# Patient Record
Sex: Female | Born: 1960 | Race: White | Hispanic: No | Marital: Married | Smoking: Never smoker
Health system: Southern US, Community
[De-identification: ages and names within clinical notes are randomized; demographics above are authoritative.]

## PROBLEM LIST (undated history)

## (undated) DIAGNOSIS — D649 Anemia, unspecified: Secondary | ICD-10-CM

## (undated) DIAGNOSIS — E079 Disorder of thyroid, unspecified: Secondary | ICD-10-CM

## (undated) DIAGNOSIS — R011 Cardiac murmur, unspecified: Secondary | ICD-10-CM

## (undated) DIAGNOSIS — E039 Hypothyroidism, unspecified: Secondary | ICD-10-CM

## (undated) DIAGNOSIS — N644 Mastodynia: Secondary | ICD-10-CM

## (undated) DIAGNOSIS — T7840XA Allergy, unspecified, initial encounter: Secondary | ICD-10-CM

## (undated) DIAGNOSIS — J309 Allergic rhinitis, unspecified: Secondary | ICD-10-CM

## (undated) DIAGNOSIS — E785 Hyperlipidemia, unspecified: Secondary | ICD-10-CM

## (undated) DIAGNOSIS — I341 Nonrheumatic mitral (valve) prolapse: Secondary | ICD-10-CM

## (undated) HISTORY — PX: MOUTH SURGERY: SHX715

## (undated) HISTORY — DX: Anemia, unspecified: D64.9

## (undated) HISTORY — PX: ABDOMINAL HYSTERECTOMY: SHX81

## (undated) HISTORY — DX: Cardiac murmur, unspecified: R01.1

## (undated) HISTORY — DX: Mastodynia: N64.4

## (undated) HISTORY — DX: Disorder of thyroid, unspecified: E07.9

## (undated) HISTORY — DX: Allergic rhinitis, unspecified: J30.9

## (undated) HISTORY — DX: Allergy, unspecified, initial encounter: T78.40XA

---

## 1999-02-24 ENCOUNTER — Encounter: Payer: Self-pay | Admitting: Oral and Maxillofacial Surgery

## 1999-02-26 ENCOUNTER — Observation Stay (HOSPITAL_COMMUNITY): Admission: RE | Admit: 1999-02-26 | Discharge: 1999-02-27 | Payer: Self-pay | Admitting: Oral and Maxillofacial Surgery

## 1999-04-24 ENCOUNTER — Other Ambulatory Visit: Admission: RE | Admit: 1999-04-24 | Discharge: 1999-04-24 | Payer: Self-pay | Admitting: Obstetrics and Gynecology

## 2000-08-09 ENCOUNTER — Other Ambulatory Visit: Admission: RE | Admit: 2000-08-09 | Discharge: 2000-08-09 | Payer: Self-pay | Admitting: Obstetrics and Gynecology

## 2000-12-23 ENCOUNTER — Ambulatory Visit (HOSPITAL_COMMUNITY): Admission: RE | Admit: 2000-12-23 | Discharge: 2000-12-23 | Payer: Self-pay | Admitting: Obstetrics and Gynecology

## 2000-12-23 ENCOUNTER — Encounter (INDEPENDENT_AMBULATORY_CARE_PROVIDER_SITE_OTHER): Payer: Self-pay | Admitting: Specialist

## 2002-06-28 ENCOUNTER — Encounter: Payer: Self-pay | Admitting: Family Medicine

## 2002-06-28 LAB — CONVERTED CEMR LAB

## 2002-07-06 ENCOUNTER — Other Ambulatory Visit: Admission: RE | Admit: 2002-07-06 | Discharge: 2002-07-06 | Payer: Self-pay | Admitting: Obstetrics and Gynecology

## 2002-09-28 HISTORY — PX: ABDOMINAL HYSTERECTOMY: SHX81

## 2003-03-01 ENCOUNTER — Observation Stay (HOSPITAL_COMMUNITY): Admission: RE | Admit: 2003-03-01 | Discharge: 2003-03-02 | Payer: Self-pay | Admitting: Obstetrics & Gynecology

## 2003-03-01 ENCOUNTER — Encounter (INDEPENDENT_AMBULATORY_CARE_PROVIDER_SITE_OTHER): Payer: Self-pay

## 2004-07-29 ENCOUNTER — Ambulatory Visit: Payer: Self-pay | Admitting: Family Medicine

## 2004-09-25 ENCOUNTER — Ambulatory Visit: Payer: Self-pay | Admitting: Internal Medicine

## 2004-11-13 ENCOUNTER — Ambulatory Visit: Payer: Self-pay | Admitting: Family Medicine

## 2005-09-09 ENCOUNTER — Ambulatory Visit: Payer: Self-pay | Admitting: Family Medicine

## 2006-01-22 ENCOUNTER — Ambulatory Visit: Payer: Self-pay | Admitting: Family Medicine

## 2006-06-14 ENCOUNTER — Ambulatory Visit: Payer: Self-pay | Admitting: Family Medicine

## 2006-06-14 ENCOUNTER — Encounter: Admission: RE | Admit: 2006-06-14 | Discharge: 2006-06-14 | Payer: Self-pay | Admitting: Family Medicine

## 2007-02-17 ENCOUNTER — Telehealth: Payer: Self-pay | Admitting: Family Medicine

## 2007-12-22 ENCOUNTER — Encounter: Payer: Self-pay | Admitting: Family Medicine

## 2007-12-22 DIAGNOSIS — J309 Allergic rhinitis, unspecified: Secondary | ICD-10-CM | POA: Insufficient documentation

## 2007-12-22 DIAGNOSIS — D649 Anemia, unspecified: Secondary | ICD-10-CM | POA: Insufficient documentation

## 2007-12-23 ENCOUNTER — Ambulatory Visit: Payer: Self-pay | Admitting: Family Medicine

## 2007-12-23 DIAGNOSIS — N644 Mastodynia: Secondary | ICD-10-CM

## 2008-07-11 ENCOUNTER — Ambulatory Visit: Payer: Self-pay | Admitting: Family Medicine

## 2008-08-16 ENCOUNTER — Ambulatory Visit: Payer: Self-pay | Admitting: Family Medicine

## 2008-08-16 DIAGNOSIS — R071 Chest pain on breathing: Secondary | ICD-10-CM | POA: Insufficient documentation

## 2008-09-26 ENCOUNTER — Ambulatory Visit: Payer: Self-pay | Admitting: Family Medicine

## 2008-09-26 DIAGNOSIS — L989 Disorder of the skin and subcutaneous tissue, unspecified: Secondary | ICD-10-CM | POA: Insufficient documentation

## 2008-09-26 LAB — CONVERTED CEMR LAB
KOH Prep: NEGATIVE
Whiff Test: NEGATIVE

## 2008-12-24 ENCOUNTER — Telehealth: Payer: Self-pay | Admitting: Family Medicine

## 2008-12-26 ENCOUNTER — Encounter: Payer: Self-pay | Admitting: Family Medicine

## 2009-04-18 ENCOUNTER — Ambulatory Visit: Payer: Self-pay | Admitting: Family Medicine

## 2009-11-27 ENCOUNTER — Telehealth: Payer: Self-pay | Admitting: Family Medicine

## 2010-10-28 NOTE — Progress Notes (Signed)
Summary: flonase and allegra  Phone Note Call from Patient Call back at [phone]   Caller: Patient Call For: Judith Part MD Summary of Call: Patient says that she has been having problems with her allergies lately, her eyes are swelling and runny nose. She said that she is now going to be using mail order pharmacy with her husband and wants to know if she can get written script for flonase and allegra to come in and pick up to send to mail order. She also wants to know if she can get enought sent to [facility] until she receives it through the mail order. Please advise.  Initial call taken by: Melody Comas,  November 27, 2009 11:50 AM  Follow-up for Phone Call        that is just fine will print one and do one for call in  just in case I have to do prior auth for allegra (often do)- I need to document that she failed or was intol of otc zyrtec and claritin please verify this just in case  here are px Follow-up by: Judith Part MD,  November 27, 2009 1:27 PM  Additional Follow-up for Phone Call Additional follow up Details #1::        Patient notified as instructed by telephone. Pt tried Zyrtec 3 years ago with no relief from symptoms and tried Claritin 2 years ago with no relief from symptoms. Prescription left at front desk. Medication phoned to [facility] as instructed. Lewanda Rife LPN  November 28, 3327 5:20 PM     New/Updated Medications: FEXOFENADINE HCL 180 MG  TABS (FEXOFENADINE HCL) take one by mouth once daily as needed ALLEGRA 180 MG TABS (FEXOFENADINE HCL) 1 by mouth once daily FLONASE 50 MCG/ACT SUSP (FLUTICASONE PROPIONATE) 2 sprays in each nostril once daily Prescriptions: FLONASE 50 MCG/ACT SUSP (FLUTICASONE PROPIONATE) 2 sprays in each nostril once daily  #3 months x 3   Entered and Authorized by:   Judith Part MD   Signed by:   Judith Part MD on 11/27/2009   Method used:   Print then Give to Patient   RxID:   [id number] ALLEGRA 180 MG TABS  (FEXOFENADINE HCL) 1 by mouth once daily  #90 x 3   Entered and Authorized by:   Judith Part MD   Signed by:   Judith Part MD on 11/27/2009   Method used:   Print then Give to Patient   RxID:   [id number] FLUTICASONE PROPIONATE 50 MCG/ACT  SUSP (FLUTICASONE PROPIONATE) 2 sprays in each nostril daily as needed  #1 mdi x 0   Entered and Authorized by:   Judith Part MD   Signed by:   Judith Part MD on 11/27/2009   Method used:   Telephoned to ...       [facility] (retail)       [address]       [place], [place]  [address]       Ph: [phone] or [phone]       Fax: [phone]   RxID:   [id number] FEXOFENADINE HCL 180 MG  TABS (FEXOFENADINE HCL) take one by mouth once daily as needed  #30 x 0   Entered and Authorized by:   Judith Part MD   Signed by:   Judith Part MD on 11/27/2009   Method used:   Telephoned to .Marland KitchenMarland Kitchen  [facility] (retail)       [address]       [place], [place]  [address]       Ph: [phone] or [phone]       Fax: [phone]   RxID:   [id number]

## 2010-11-21 ENCOUNTER — Ambulatory Visit (INDEPENDENT_AMBULATORY_CARE_PROVIDER_SITE_OTHER): Payer: BC Managed Care – PPO | Admitting: Family Medicine

## 2010-11-21 ENCOUNTER — Encounter: Payer: Self-pay | Admitting: Family Medicine

## 2010-11-21 DIAGNOSIS — J019 Acute sinusitis, unspecified: Secondary | ICD-10-CM

## 2010-11-25 NOTE — Assessment & Plan Note (Signed)
Summary: ?SINUS INFECTION,EAR/CLE   [employer]   Vital Signs:  Patient profile:   50 year old female Weight:      136 pounds Temp:     98 degrees F oral Pulse rate:   68 / minute Pulse rhythm:   regular BP sitting:   128 / 88  (left arm) Cuff size:   regular  Vitals Entered By: Lowella Petties CMA, AAMA (November 21, 2010 2:59 PM) CC: Left side face pain, sinus drainage, left ear hurtin, sore throat.   History of Present Illness: CC: sinus infection?  1+ mo h/o not feeling great - sinus congestion, pressure headache, sore throat, L side of face hurting.  + R sided epistaxis today.  Taking allegra consistently over last several weeks.  taking ibuprofen as well.  was taking flonase but ran out last month.  also using mucinex.  clear discharge from nose, sometimes yellow.  + eyes itcy this week.  in ams crusty.  Has been using visine drops.   No cough, fevers/chills, abd pain, n/v/d, rashes.  No sick contacts at home,  no smokers at home, no h/o asthma.    Current Medications (verified): 1)  Fexofenadine Hcl 180 Mg  Tabs (Fexofenadine Hcl) .... Take One By Mouth Once Daily As Needed 2)  Fluticasone Propionate 50 Mcg/act  Susp (Fluticasone Propionate) .... 2 Sprays in Each Nostril Daily As Needed  Allergies (verified): 1)  ! Penicillin 2)  ! Zithromax  Past History:  Past Medical History: Last updated: 09/26/2008 BREAST PAIN (ICD-611.71) ANEMIA-NOS (ICD-285.9) ALLERGIC RHINITIS (ICD-477.9)   gyn-Dr Dareen Piano    Social History: Last updated: 07/11/2008 Marital Status: Married Children: 2 Occupation: Runner, broadcasting/film/video non smoker   Review of Systems       per HPI  Physical Exam  General:  alert, well-developed, well-nourished, and well-hydrated.  NAD Head:  normocephalic, atraumatic, and no abnormalities observed.  L maxillary sinus tenderness to palpation Eyes:  pupils equal, pupils round, and no injection.   Ears:  TMs clear bilaterally, canals clear Nose:  R side with dry  blood, L with congestion Mouth:  good dentition, pharynx pink and moist, and no exudates.   Neck:  No deformities, masses, or tenderness noted.  no LAD Lungs:  Normal respiratory effort, chest expands symmetrically. Lungs are clear to auscultation, no crackles or wheezes. Heart:  Normal rate and regular rhythm. S1 and S2 normal without gallop, murmur, click, rub or other extra sounds. Pulses:  2+ rad pulses, brisk cap refill   Impression & Recommendations:  Problem # 1:  SINUSITIS- ACUTE-NOS (ICD-461.9) vs chronic as going on >1 mo.  however no abx treatment up to now.  doing appropriate supportive care at home, rec back off flonase until bleeding resolves, rec back off allegra as can be drying.  start bactrim DS two times a day x 10 days.  update if not improving as expected.  for presumed allergic conjunctivitis treat with patanol eye drops.  The following medications were removed from the medication list:    Flonase 50 Mcg/act Susp (Fluticasone propionate) .Marland Kitchen... 2 sprays in each nostril once daily Her updated medication list for this problem includes:    Fluticasone Propionate 50 Mcg/act Susp (Fluticasone propionate) .Marland Kitchen... 2 sprays in each nostril daily as needed    Bactrim Ds 800-160 Mg Tabs (Sulfamethoxazole-trimethoprim) ..... One by mouth two times a day x 10 days  Complete Medication List: 1)  Fexofenadine Hcl 180 Mg Tabs (Fexofenadine hcl) .... Take one by mouth once daily as  needed 2)  Fluticasone Propionate 50 Mcg/act Susp (Fluticasone propionate) .... 2 sprays in each nostril daily as needed 3)  Patanol 0.1 % Soln (Olopatadine hcl) .... One drop two times a day to each eye for allergies 4)  Bactrim Ds 800-160 Mg Tabs (Sulfamethoxazole-trimethoprim) .... One by mouth two times a day x 10 days  Patient Instructions: 1)  You have a sinus infection. 2)  Take medicines as prescribed: bactrim ds twice daily for 10 days 3)  Take guaifenesin 400mg  IR 1 1/2 pills in am and at noon or  simple mucinex with plenty of fluid to help mobilize mucous. 4)  Use nasal saline spray or neti pot to help drainage of sinuses. 5)  hold off on allegra 6)  hold off on flonase until bleeding resolves. 7)  patanol for eye itch. 8)  If you start having fevers >101.5, trouble swallowing or breathing, or are worsening instead of improving as expected, you may need to be seen again. 9)  Good to see you today, call clinic with questions.  Prescriptions: BACTRIM DS 800-160 MG TABS (SULFAMETHOXAZOLE-TRIMETHOPRIM) one by mouth two times a day x 10 days  #20 x 0   Entered and Authorized by:   Eustaquio Boyden  MD   Signed by:   Eustaquio Boyden  MD on 11/21/2010   Method used:   Electronically [facility]        [id number] ([facility]ail)       [address]       [place], [place]  [address]       Ph: [phone] or [phone]       Fax: [phone]   RxID:   [id number] PATANOL 0.1 % SOLN (OLOPATADINE HCL) one drop two times a day to each eye for allergies  #1 x 1   Entered and Authorized by:   Eustaquio Boyden  MD   Signed by:   Eustaquio Boyden  MD on 11/21/2010   Method used:   Electronically to        [facility] (retail)       [address]       [place], [place]  [address]       Ph: [phone] or [phone]       Fax: [phone]   RxID:   [id number]    Orders Added: 1)  Est. Patient Level III [20254]    Prior Medications (reviewed today): FEXOFENADINE HCL 180 MG  TABS (FEXOFENADINE HCL) take one by mouth once daily as needed FLUTICASONE PROPIONATE 50 MCG/ACT  SUSP (FLUTICASONE PROPIONATE) 2 sprays in each nostril daily as needed Current Allergies (reviewed today): ! PENICILLIN ! [facility]

## 2011-02-13 NOTE — Op Note (Signed)
[facility]  Patient:    Ruth Gill, Ruth Gill                     MRN: [id number] Proc. Date: 12/23/00 Adm. Date:  [id number] Attending:  Osborn Coho                           Operative Report  PREOPERATIVE DIAGNOSES:  1. Menorrhagia.  2. Dyspareunia.  3. History of endometriosis.  POSTOPERATIVE DIAGNOSES:  1. Menorrhagia.  2. Dyspareunia.  3. History of endometriosis.  4. Endometriosis on the right uterosacral ligament.  PROCEDURE:  1. Diagnostic laparoscopy with cauterization of endometriosis.  2. Hysteroscopy.  3. Dilation and curettage.  SURGEON:  Dr. Dareen Piano.  ANESTHESIA:  General endotracheal.  ESTIMATED BLOOD LOSS:  Minimal.  COMPLICATIONS:  None.  DRAINS:  Red rubber catheter bladder.  SPECIMENS:  Endocervical and endometrial curettings sent to pathology.  DESCRIPTION OF PROCEDURE:  The patient was taken to the operating room where she was placed in the dorsal supine position. A general endotracheal anesthetic was administered without complications. She was then placed in the dorsal lithotomy position and prepped with Betadine. Her bladder was drained with a red rubber catheter. A Hulka tenaculum was applied to the anterior cervical lip. She was then draped in the usual fashion for this procedure. The umbilicus was then injected with 0.25% Marcaine. A vertical skin incision was made, a Veress needle was placed in the peritoneal cavity. Three liters of carbon dioxide was placed. The 11 mm trocar was then placed in the peritoneal cavity. The scope was then placed. A 5 mm port was placed in the suprapubic region under direct visualization. On examination, the patient had a normal appearing liver and gallbladder. The appendix was not visualized. It was either previously removed or retrocecal. The patient had a normal anterior cul-de-sac. The uterus appeared to be normal. The fallopian tubes and ovaries were normal bilaterally,  pelvic side walls were normal. The patient had one area of endometriosis in the mid portion of the right uterosacral ligaments otherwise no other endometriosis was found. This area was then cauterized with Klepinger forceps. This concluded the laparoscopy. The instruments were removed and pneumoperitoneum release and the fascia closed with #0 Vicryl suture. The skin was closed with 4-0 Vicryl suture. Steri-Strips were then placed. The patient tolerated the procedure well. Next hysteroscopy was performed. The cervical os was then dilated to a 29 french. The hysteroscope was advanced through the endocervical canal which appeared to be normal. On entering the uterine cavity, the patient had extensive thickening of her endometrial lining with some polyps. There was no evidence of any fibroids. The hysteroscope was removed and sharp curettage was performed obtaining endometrial and endocervical curettings. The hysteroscope was then placed back into the uterine cavity and no evidence of polyps were seen. Again no fibroids were seen. The patient tolerated the procedure well. She was taken to the recovery room in stable condition. Instrument and lap counts were correct x 2. The patient was discharged to home with Tylox to take p.r.n. She will follow-up in the office in four weeks. DD:  12/23/00 TD:  12/24/00 Job: [id number]

## 2011-02-13 NOTE — H&P (Signed)
NAME:  Ruth Gill, Ruth Gill                        ACCOUNT NO.:  [id number]   MEDICAL RECORD NO.:  [id number]                   PATIENT TYPE:  INP   LOCATION:  NA                                   FAC[facility]   PHYSICIAN:  Malva Limes, M.D.                 DATE OF BIRTH:  1961-06-27   DATE OF ADMISSION:  DATE OF DISCHARGE:                                HISTORY & PHYSICAL   HISTORY OF PRESENT ILLNESS:  The patient is a 50 year old white female G2,  P2 who presents today for laparoscopic assisted vaginal hysterectomy  secondary to a multiple year history of worsening menometrorrhagia and  dyspareunia.  The patient has a history of endometriosis.  The patient  underwent diagnostic laparoscopy with cauterization of endometriosis in  2002.  At that time endometriosis was involving the right uterosacral  ligament.  The patient also had a hysteroscopy and dilatation and curettage  at that time.  There was no evidence of any polyps or even fibroids.  The  pathology was benign.  The patient presented to the office complaining of  worsening symptoms in March of 2004.  The patient was offered endometrial  ablation, intrauterine device, total vaginal hysterectomy, or another  dilatation and curettage.  The patient did not receive any relief with oral  contraceptive pills.  The patient wished to proceed with total vaginal  hysterectomy.  Because of her history of endometriosis, the patient will  have a laparoscopy prior to proceeding with a vaginal hysterectomy.   ALLERGIES:  The patient has allergy to PENICILLIN and ZITHROMAX.   SOCIAL HISTORY:  She denies smoking or drug use.   PAST OBSTETRICAL HISTORY:  The patient has had one vaginal birth after  cesarean section and one cesarean section.   PHYSICAL EXAMINATION:  GENERAL:  The patient is a well-developed, well-  nourished white female in no apparent distress.  HEENT:  Within normal limits.  LUNGS:  Clear to auscultation.  CARDIOVASCULAR:  Regular rate and rhythm without murmur.  BREASTS:  Without masses, lymphadenopathy.  ABDOMEN:  Soft, nontender, nondistended.  There is no organomegaly.  There  is no rebound or guarding.  EXTREMITIES:  Within normal limits.  PELVIC:  Normal external genitalia.  The vagina is without lesions or  masses.  Cervix is parous.  Uterus is normal size and shape.  There is no  adnexal tenderness or masses.   IMPRESSION:  1. Menometrorrhagia.  2. Dyspareunia.  3. History of endometriosis.   PLAN:  Proceed with diagnostic laparoscopy followed by total vaginal  hysterectomy.                                               Malva Limes, M.D.   MA/MEDQ  D:  03/01/2003  T:  03/01/2003  Job:  [id number]

## 2011-02-13 NOTE — Op Note (Signed)
Ruth Gill, Ruth Gill                        ACCOUNT NO.:  [id number]   MEDICAL RECORD NO.:  [id number]                   PATIENT TYPE:  OBV   LOCATION:  9399                                 FACILITY:  [facility]   PHYSICIAN:  Malva Limes, M.D.                 DATE OF BIRTH:  1960/12/15   DATE OF PROCEDURE:  02/28/2002  DATE OF DISCHARGE:                                 OPERATIVE REPORT   PREOPERATIVE DIAGNOSES:  1. Menometrorrhagia.  2. Dyspareunia.  3. History of endometriosis.   POSTOPERATIVE DIAGNOSES:  1. Menometrorrhagia.  2. Dyspareunia.  3. History of endometriosis.   PROCEDURE:  1. Diagnostic laparoscopy.  2. Total vaginal hysterectomy.   SURGEON:  Malva Limes, M.D.   ASSISTANT:  Randye Lobo, M.D.   ESTIMATED BLOOD LOSS:  100 mL.   COMPLICATIONS:  None.   SPECIMENS:  Uterus and cervix sent to pathology.   ANTIBIOTICS:  Ancef 1 gram.   ANESTHESIA:  General endotracheal.   DRAINS:  Foley to bedside drainage.   DESCRIPTION OF PROCEDURE:  The patient was taken to the operating room where  general anesthetic was administered without complications.  She was then  placed in the dorsal lithotomy position and prepped with Hibiclens.  Her  bladder was drained with a red rubber catheter.  A Hulka tenaculum was  applied to the anterior cervical lip.  A vertical skin incision was made in  the umbilicus.  The fascia was grasped, incised with the Mayos, and the  parietal peritoneum was entered bluntly.  A Hasson cannula was then placed  in the peritoneal cavity and 3 liters of carbon dioxide was insufflated.  A  5 mm port was then placed in the suprapubic region under direct  visualization.  The patient was then placed in Trendelenburg.  On  examination the patient had no evidence of endometriosis in her abdomen or  pelvis.  There was no evidence of any adhesions.  The uterus appeared to be  boggy and extremely retroverted and retroflexed.  The ovaries and  fallopian  tubes were normal bilaterally.  The appendix was not visualized.  The  patient did have a small simple cyst on the left ovary which was incised and  clear serous fluid seen to be draining.   At this point attention was turned to the vagina.  The cervix was injected  with 1% lidocaine with epinephrine.  The posterior cul-de-sac was entered  sharply.  The uterosacral ligaments were bilaterally clamped, cut, and  ligated with 0 Monocryl suture.  The remaining cervix was circumscribed.  The anterior cul-de-sac was entered sharply.  The cardinal ligaments were  then serially clamped, cut, and ligated with 0 Monocryl suture.  The uterine  vessels were bilaterally clamped, cut, and ligated with 0 Monocryl suture.  Once the level of the fallopian tube was reached the uterus was inverted and  the fallopian tube, round ligament,  and ovarian ligament were bilaterally  clamped, cut, and ligated x2 with 0 Monocryl suture.  All pedicles were then  checked and found to be hemostatic.  There was a small area of bleeding on  the vaginal mucosa at 3 o'clock which was sutured with 0 Monocryl suture in  figure-of-eight fashion.  Once this was accomplished the parietal peritoneum  and vaginal mucosa were closed using 2-0 Vicryl suture in a running locking  fashion.  Following this the abdomen was re-insufflated and the scope was  placed and all pedicles again were checked and felt to be hemostatic.  The  pelvis was copiously irrigated.  This concluded the procedure.  The  instruments and pneumoperitoneum released.  The fascia was closed with 0  Vicryl suture.  The skin was closed with 4-0 Vicryl suture.  The patient  tolerated the procedure well and she was taken to the recovery room in  stable condition.  Instrument and lap counts were correct x2.                                               Malva Limes, M.D.    MA/MEDQ  D:  03/01/2003  T:  03/01/2003  Job:  [id number]

## 2011-09-01 ENCOUNTER — Encounter: Payer: Self-pay | Admitting: Family Medicine

## 2011-09-02 ENCOUNTER — Encounter: Payer: Self-pay | Admitting: Family Medicine

## 2011-09-02 ENCOUNTER — Ambulatory Visit (INDEPENDENT_AMBULATORY_CARE_PROVIDER_SITE_OTHER): Payer: BC Managed Care – PPO | Admitting: Family Medicine

## 2011-09-02 DIAGNOSIS — J0100 Acute maxillary sinusitis, unspecified: Secondary | ICD-10-CM

## 2011-09-02 MED ORDER — LEVOFLOXACIN 500 MG PO TABS: 500.0000 mg | ORAL_TABLET | Freq: Every day | ORAL | Status: AC

## 2011-09-02 NOTE — Progress Notes (Signed)
  Patient Name: Ruth Gill Date of Birth: 10-29-1960 Age: 50 y.o. Medical Record Number: [id number] Gender: female  History of Present Illness:  Ruth Gill is a 50 y.o. very pleasant female patient who presents with the following:  HA, some fever. At first was clear.  Hard to breath at night.  Left side more.   Patient has had mild fever, nasal drainage, cough, but mostly worsening sinus pain the last couple of days, sick around a week. Left sided max pain the greatest. Some discharge. Some ear pain.   I have reviewed the patient's medical history in detail and updated the computerized patient record.  ROS: GEN: Acute illness details above GI: Tolerating PO intake GU: maintaining adequate hydration and urination Pulm: No SOB Interactive and getting along well at home.  Otherwise, ROS is as per the HPI.    Gen: WDWN, NAD; alert,appropriate and cooperative throughout exam  HEENT: Normocephalic and atraumatic. Throat clear, w/o exudate, no LAD, R TM clear, L TM - good landmarks, No fluid present. rhinnorhea.  Left frontal and maxillary sinuses: Tender at max Right frontal and maxillary sinuses: non Tender  Neck: No ant or post LAD CV: RRR, No M/G/R Pulm: Breathing comfortably in no resp distress. no w/c/r Abd: S,NT,ND,+BS Extr: no c/c/e Psych: full affect, pleasant  Acute sinusitis: ABX as below.  Refer to the patient instructions sections for details of plan shared with patient.  Reviewed symptomatic care as well as ABX in this case.

## 2011-09-25 ENCOUNTER — Telehealth: Payer: Self-pay | Admitting: *Deleted

## 2011-09-25 NOTE — Telephone Encounter (Signed)
error 

## 2012-08-05 ENCOUNTER — Ambulatory Visit (INDEPENDENT_AMBULATORY_CARE_PROVIDER_SITE_OTHER): Payer: BC Managed Care – PPO | Admitting: Family Medicine

## 2012-08-05 ENCOUNTER — Encounter: Payer: Self-pay | Admitting: Family Medicine

## 2012-08-05 VITALS — BP 124/76 | HR 64 | Temp 97.5°F | Ht 63.0 in | Wt 121.0 lb

## 2012-08-05 DIAGNOSIS — J019 Acute sinusitis, unspecified: Secondary | ICD-10-CM | POA: Insufficient documentation

## 2012-08-05 DIAGNOSIS — B9689 Other specified bacterial agents as the cause of diseases classified elsewhere: Secondary | ICD-10-CM

## 2012-08-05 MED ORDER — LEVOFLOXACIN 500 MG PO TABS: 500.0000 mg | ORAL_TABLET | Freq: Every day | ORAL | Status: DC

## 2012-08-05 MED ORDER — FLUTICASONE PROPIONATE 50 MCG/ACT NA SUSP: 2.0000 | Freq: Every day | NASAL | Status: DC | PRN

## 2012-08-05 NOTE — Assessment & Plan Note (Signed)
With R sided sinus and ear pain / tenderness after over 7 d of uri tx with levaquin (based on abx all) Disc symptomatic care - see instructions on AVS  Will also get back on flonase for ETD

## 2012-08-05 NOTE — Progress Notes (Signed)
  Subjective:    Patient ID: Ruth Gill, female    DOB: [birth year], 51 y.o.   MRN: [id number]  HPI Thinks she has a sinus infection  Has been over a week of symptoms- congestion and drainage and pressure/ pain on R side of face and ear Teeth hurt too Feels like her ears need to pop Mucous is clear thus far No fever  A little dry cough- not bad  Has taken allegra and some ibuprofen  Is out of flonase  Does do salt water rinse   Patient Active Problem List  Diagnosis  . ANEMIA-NOS  . ALLERGIC RHINITIS  . BREAST PAIN  . UNSPECIFIED DISORDER OF SKIN&SUBCUTANEOUS TISSUE  . CHEST WALL PAIN, ACUTE   Past Medical History  Diagnosis Date  . Mastodynia   . Anemia, unspecified   . Allergic rhinitis, cause unspecified    Past Surgical History  Procedure Date  . Abdominal hysterectomy    History  Substance Use Topics  . Smoking status: Never Smoker   . Smokeless tobacco: Not on file  . Alcohol Use: No   No family history on file. Allergies  Allergen Reactions  . Azithromycin     REACTION: joint swelling, stiffness  . Penicillins     REACTION: SOB, rash   Current Outpatient Prescriptions on File Prior to Visit  Medication Sig Dispense Refill  . estradiol (ESTRACE) 1 MG tablet Take 1 tablet by mouth daily.       . fexofenadine (ALLEGRA) 180 MG tablet Take 180 mg by mouth daily as needed.        . fluticasone (FLONASE) 50 MCG/ACT nasal spray Place 2 sprays into the nose daily as needed.           Review of Systems Review of Systems  Constitutional: Negative for fever, appetite change,  and unexpected weight change.  ENT pos for congestion and drip and facial pain  Eyes: Negative for pain and visual disturbance. neg for redness Respiratory: Negative for wheeze and shortness of breath.   Cardiovascular: Negative for cp or palpitations    Gastrointestinal: Negative for nausea, diarrhea and constipation.  Genitourinary: Negative for urgency and frequency.  Skin:  Negative for pallor or rash   Neurological: Negative for weakness, light-headedness, numbness and headaches.  Hematological: Negative for adenopathy. Does not bruise/bleed easily.  Psychiatric/Behavioral: Negative for dysphoric mood. The patient is not nervous/anxious.         Objective:   Physical Exam  Constitutional: She appears well-developed and well-nourished. No distress.  HENT:  Head: Normocephalic and atraumatic.  Right Ear: External ear normal.  Left Ear: External ear normal.  Mouth/Throat: Oropharynx is clear and moist. No oropharyngeal exudate.       Nares are injected and congested  Significant frontal sinus tenderness  Marked R maxillary sinus tenderness  Eyes: Conjunctivae normal and EOM are normal. Pupils are equal, round, and reactive to light. Right eye exhibits no discharge. Left eye exhibits no discharge.  Neck: Normal range of motion. Neck supple. No thyromegaly present.  Cardiovascular: Normal rate and regular rhythm.   Pulmonary/Chest: Effort normal and breath sounds normal. No respiratory distress. She has no wheezes. She has no rales.  Lymphadenopathy:    She has no cervical adenopathy.  Neurological: She is alert. No cranial nerve deficit.  Skin: Skin is warm and dry. No rash noted.  Psychiatric: She has a normal mood and affect.          Assessment & Plan:

## 2012-08-05 NOTE — Patient Instructions (Addendum)
For sinus infection take levaquin as directed  Drink lots of water Get back on flonase daily  In addition - do salt water nasal rinse any time you want  mucinex is good for congestion as is ibuprofen Update if not starting to improve in a week or if worsening

## 2012-12-23 ENCOUNTER — Ambulatory Visit (INDEPENDENT_AMBULATORY_CARE_PROVIDER_SITE_OTHER): Payer: BC Managed Care – PPO | Admitting: Internal Medicine

## 2012-12-23 ENCOUNTER — Encounter: Payer: Self-pay | Admitting: Internal Medicine

## 2012-12-23 VITALS — BP 110/80 | HR 69 | Temp 97.5°F | Wt 123.0 lb

## 2012-12-23 DIAGNOSIS — J019 Acute sinusitis, unspecified: Secondary | ICD-10-CM

## 2012-12-23 DIAGNOSIS — B9689 Other specified bacterial agents as the cause of diseases classified elsewhere: Secondary | ICD-10-CM

## 2012-12-23 MED ORDER — LEVOFLOXACIN 500 MG PO TABS: 500.0000 mg | ORAL_TABLET | Freq: Every day | ORAL | Status: DC

## 2012-12-23 NOTE — Progress Notes (Signed)
  Subjective:    Patient ID: Ruth Gill, female    DOB: 10-10-1960, 52 y.o.   MRN: [id number]  HPI Having sinus infection symptoms again Pain in right maxillary area and down along neck Pain in right ear Congestion and blowing out yellow mucus Going on for a week  Using flonase, aerborne, ibuprofen, mucinex D---not really helping  No fever No SOB No sore fever Does feel the PND  Current Outpatient Prescriptions on File Prior to Visit  Medication Sig Dispense Refill  . estradiol (ESTRACE) 1 MG tablet Take 1 tablet by mouth daily.       . fexofenadine (ALLEGRA) 180 MG tablet Take 180 mg by mouth daily as needed.        . fluticasone (FLONASE) 50 MCG/ACT nasal spray Place 2 sprays into the nose daily as needed.  16 g  11   No current facility-administered medications on file prior to visit.    Allergies  Allergen Reactions  . Azithromycin     REACTION: joint swelling, stiffness  . Penicillins     REACTION: SOB, rash    Past Medical History  Diagnosis Date  . Mastodynia   . Anemia, unspecified   . Allergic rhinitis, cause unspecified     Past Surgical History  Procedure Laterality Date  . Abdominal hysterectomy      No family history on file.  History   Social History  . Marital Status: Married    Spouse Name: N/A    Number of Children: 2  . Years of Education: N/A   Occupational History  . teacher [employer]   Social History Main Topics  . Smoking status: Never Smoker   . Smokeless tobacco: Not on file  . Alcohol Use: No  . Drug Use: No  . Sexually Active: Not on file   Other Topics Concern  . Not on file   Social History Narrative  . No narrative on file   Review of Systems Voice goes by the end of the day No rash  Appetite okay No vomiting or diarrhea     Objective:   Physical Exam  Constitutional: She appears well-developed and well-nourished. No distress.  HENT:  Mouth/Throat: Oropharynx is clear and moist. No  oropharyngeal exudate.  TMs normal Right maxillary tenderness Moderate nasal inflammation  Neck: Normal range of motion. Neck supple.  Small right anterior cervical node  Pulmonary/Chest: Effort normal and breath sounds normal. No respiratory distress. She has no wheezes. She has no rales.  Lymphadenopathy:    She has cervical adenopathy.          Assessment & Plan:

## 2012-12-23 NOTE — Assessment & Plan Note (Signed)
Sick for a week with ongoing symptoms Will treat with the levaquin she has tolerated in the past Discussed symtpomatic Rx

## 2013-08-03 ENCOUNTER — Other Ambulatory Visit: Payer: Self-pay | Admitting: Obstetrics and Gynecology

## 2013-08-04 LAB — HM MAMMOGRAPHY

## 2013-09-25 ENCOUNTER — Other Ambulatory Visit: Payer: Self-pay | Admitting: Family Medicine

## 2013-09-25 NOTE — Telephone Encounter (Signed)
Fax refill request, no recent/future appt. please advise

## 2013-09-25 NOTE — Telephone Encounter (Signed)
Please have her schedule a PE in spring or summer and refill until then

## 2013-09-26 ENCOUNTER — Encounter: Payer: Self-pay | Admitting: *Deleted

## 2013-09-26 MED ORDER — FLUTICASONE PROPIONATE 50 MCG/ACT NA SUSP: 2.0000 | Freq: Every day | NASAL | Status: DC | PRN

## 2013-09-26 NOTE — Telephone Encounter (Signed)
CPE scheduled and med refilled  

## 2013-09-26 NOTE — Telephone Encounter (Signed)
This encounter was created in error - please disregard.

## 2014-01-04 ENCOUNTER — Telehealth: Payer: Self-pay | Admitting: Family Medicine

## 2014-01-04 DIAGNOSIS — Z0000 Encounter for general adult medical examination without abnormal findings: Secondary | ICD-10-CM

## 2014-01-04 NOTE — Telephone Encounter (Signed)
Message copied by Judy PimpleWER, Seini Lannom A on Thu Jan 04, 2014 10:13 PM ------      Message from: Alvina ChouWALSH, TERRI J      Created: Tue Dec 26, 2013 12:43 PM      Regarding: Lab orders for Monday, 4.13.15       Patient is scheduled for CPX labs, please order future labs, Thanks , Terri       ------

## 2014-01-08 ENCOUNTER — Other Ambulatory Visit (INDEPENDENT_AMBULATORY_CARE_PROVIDER_SITE_OTHER): Payer: BC Managed Care – PPO

## 2014-01-08 DIAGNOSIS — Z0000 Encounter for general adult medical examination without abnormal findings: Secondary | ICD-10-CM

## 2014-01-08 LAB — COMPREHENSIVE METABOLIC PANEL
ALK PHOS: 39 U/L (ref 39–117)
ALT: 15 U/L (ref 0–35)
AST: 20 U/L (ref 0–37)
Albumin: 4.3 g/dL (ref 3.5–5.2)
BUN: 19 mg/dL (ref 6–23)
CO2: 29 mEq/L (ref 19–32)
Calcium: 9.5 mg/dL (ref 8.4–10.5)
Chloride: 103 mEq/L (ref 96–112)
Creatinine, Ser: 0.7 mg/dL (ref 0.4–1.2)
GFR: 89.59 mL/min (ref >60.00)
GLUCOSE: 80 mg/dL (ref 70–99)
Potassium: 4.6 mEq/L (ref 3.5–5.1)
SODIUM: 140 meq/L (ref 135–145)
TOTAL PROTEIN: 7.3 g/dL (ref 6.0–8.3)
Total Bilirubin: 0.3 mg/dL (ref 0.3–1.2)

## 2014-01-08 LAB — CBC WITH DIFFERENTIAL/PLATELET
BASOS ABS: 0 10*3/uL (ref 0.0–0.1)
Basophils Relative: 1 % (ref 0.0–3.0)
EOS PCT: 5.4 % — AB (ref 0.0–5.0)
Eosinophils Absolute: 0.2 10*3/uL (ref 0.0–0.7)
HCT: 39.8 % (ref 36.0–46.0)
Hemoglobin: 13.6 g/dL (ref 12.0–15.0)
Lymphocytes Relative: 41.9 % (ref 12.0–46.0)
Lymphs Abs: 1.8 10*3/uL (ref 0.7–4.0)
MCHC: 34.2 g/dL (ref 30.0–36.0)
MCV: 92.7 fl (ref 78.0–100.0)
MONO ABS: 0.3 10*3/uL (ref 0.1–1.0)
Monocytes Relative: 7.4 % (ref 3.0–12.0)
Neutro Abs: 1.9 10*3/uL (ref 1.4–7.7)
Neutrophils Relative %: 44.3 % (ref 43.0–77.0)
Platelets: 171 10*3/uL (ref 150.0–400.0)
RBC: 4.29 Mil/uL (ref 3.87–5.11)
RDW: 12.1 % (ref 11.5–14.6)
WBC: 4.3 10*3/uL — AB (ref 4.5–10.5)

## 2014-01-08 LAB — TSH: TSH: 5.63 u[IU]/mL — AB (ref 0.35–5.50)

## 2014-01-08 LAB — LIPID PANEL
CHOL/HDL RATIO: 3
Cholesterol: 187 mg/dL (ref 0–200)
HDL: 63.4 mg/dL (ref >39.00)
LDL Cholesterol: 110 mg/dL — ABNORMAL HIGH (ref 0–99)
Triglycerides: 66 mg/dL (ref 0.0–149.0)
VLDL: 13.2 mg/dL (ref 0.0–40.0)

## 2014-01-12 ENCOUNTER — Encounter: Payer: Self-pay | Admitting: Family Medicine

## 2014-01-12 ENCOUNTER — Ambulatory Visit (INDEPENDENT_AMBULATORY_CARE_PROVIDER_SITE_OTHER): Payer: BC Managed Care – PPO | Admitting: Family Medicine

## 2014-01-12 VITALS — BP 118/78 | HR 55 | Temp 98.3°F | Ht 63.0 in | Wt 124.2 lb

## 2014-01-12 DIAGNOSIS — Z23 Encounter for immunization: Secondary | ICD-10-CM

## 2014-01-12 DIAGNOSIS — Z1211 Encounter for screening for malignant neoplasm of colon: Secondary | ICD-10-CM

## 2014-01-12 DIAGNOSIS — R7989 Other specified abnormal findings of blood chemistry: Secondary | ICD-10-CM

## 2014-01-12 DIAGNOSIS — R946 Abnormal results of thyroid function studies: Secondary | ICD-10-CM

## 2014-01-12 DIAGNOSIS — Z0000 Encounter for general adult medical examination without abnormal findings: Secondary | ICD-10-CM

## 2014-01-12 NOTE — Progress Notes (Signed)
Subjective:    Patient ID: Ruth RhymeBelinda M Gill, female    DOB: [birth year], 53 y.o.   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 When it was -will get that today  Mammogram nov 7th - nl  Pap smear nov 7th -nl (her gyn will do pap every 3 years)  Self exam-no breast lumps or changes   Colon screen- she has never had a colonoscopy  Will do ifob   Flu vaccine - does not get flu vaccines   Pap 11/14 Had tot hysterectomy On hrt-no problems at all (hot flashes and vaginal dryness were bad)   Lab Results  Component Value Date   TSH 5.63* 01/08/2014    No symptoms at all   Results for orders placed in visit on 01/08/14  LIPID PANEL      Result Value Ref Range   Cholesterol 187  0 - 200 mg/dL   Triglycerides 82.966.0  0.0 - 149.0 mg/dL   HDL 56.2163.40  >30.86>39.00 mg/dL   VLDL 57.813.2  0.0 - 46.940.0 mg/dL   LDL Cholesterol 629110 (*) 0 - 99 mg/dL   Total CHOL/HDL Ratio 3    TSH      Result Value Ref Range   TSH 5.63 (*) 0.35 - 5.50 uIU/mL  CBC WITH DIFFERENTIAL      Result Value Ref Range   WBC 4.3 (*) 4.5 - 10.5 K/uL   RBC 4.29  3.87 - 5.11 Mil/uL   Hemoglobin 13.6  12.0 - 15.0 g/dL   HCT 52.839.8  41.336.0 - 24.446.0 %   MCV 92.7  78.0 - 100.0 fl   MCHC 34.2  30.0 - 36.0 g/dL   RDW 01.012.1  27.211.5 - 53.614.6 %   Platelets 171.0  150.0 - 400.0 K/uL   Neutrophils Relative % 44.3  43.0 - 77.0 %   Lymphocytes Relative 41.9  12.0 - 46.0 %   Monocytes Relative 7.4  3.0 - 12.0 %   Eosinophils Relative 5.4 (*) 0.0 - 5.0 %   Basophils Relative 1.0  0.0 - 3.0 %   Neutro Abs 1.9  1.4 - 7.7 K/uL   Lymphs Abs 1.8  0.7 - 4.0 K/uL   Monocytes Absolute 0.3  0.1 - 1.0 K/uL   Eosinophils  Absolute 0.2  0.0 - 0.7 K/uL   Basophils Absolute 0.0  0.0 - 0.1 K/uL  COMPREHENSIVE METABOLIC PANEL      Result Value Ref Range   Sodium 140  135 - 145 mEq/L   Potassium 4.6  3.5 - 5.1 mEq/L   Chloride 103  96 - 112 mEq/L   CO2 29  19 - 32 mEq/L   Glucose, Bld 80  70 - 99 mg/dL   BUN 19  6 - 23 mg/dL   Creatinine, Ser 0.7  0.4 - 1.2 mg/dL   Total Bilirubin 0.3  0.3 - 1.2 mg/dL   Alkaline Phosphatase 39  39 - 117 U/L   AST 20  0 - 37 U/L   ALT 15  0 - 35 U/L   Total Protein 7.3  6.0 - 8.3 g/dL   Albumin 4.3  3.5 - 5.2 g/dL   Calcium 9.5  8.4 - 16.110.5 mg/dL   GFR 09.6089.59  >45.40>60.00 mL/min    Lab Results  Component Value Date   CHOL 187 01/08/2014   HDL 63.40 01/08/2014   LDLCALC 110* 01/08/2014   TRIG 66.0 01/08/2014   CHOLHDL 3 01/08/2014   really good cholesterol profile    Review of Systems Review of Systems  Constitutional: Negative for fever, appetite change, fatigue and unexpected weight change.  Eyes: Negative for pain and visual disturbance.  Respiratory: Negative for cough and shortness of breath.   Cardiovascular: Negative for cp or palpitations    Gastrointestinal: Negative for nausea, diarrhea and constipation.  Genitourinary: Negative for urgency and frequency.  Skin: Negative for pallor or rash   Neurological: Negative for weakness, light-headedness, numbness and headaches.  Hematological: Negative for adenopathy. Does not bruise/bleed easily.  Psychiatric/Behavioral: Negative for dysphoric mood. The patient is not nervous/anxious.         Objective:   Physical Exam  Constitutional: She appears well-developed and well-nourished. No distress.  HENT:  Head: Normocephalic and atraumatic.  Right Ear: External ear normal.  Left Ear: External ear normal.  Nose: Nose normal.  Mouth/Throat: Oropharynx is clear and moist.  Eyes: Conjunctivae and EOM are normal. Pupils are equal, round, and reactive to light. Right eye exhibits no discharge. Left eye exhibits no  discharge. No scleral icterus.  Neck: Normal range of motion. Neck supple. No JVD present. No thyromegaly present.  Cardiovascular: Normal rate, regular rhythm, normal heart sounds and intact distal pulses.  Exam reveals no gallop.   Pulmonary/Chest: Effort normal and breath sounds normal. No respiratory distress. She has no wheezes. She has no rales.  Abdominal: Soft. Bowel sounds are normal. She exhibits no distension and no mass. There is no tenderness.  Musculoskeletal: She exhibits no edema and no tenderness.  Lymphadenopathy:    She has no cervical adenopathy.  Neurological: She is alert. She has normal reflexes. No cranial nerve deficit. She exhibits normal muscle tone. Coordination normal.  Skin: Skin is warm and dry. No rash noted. No erythema. No pallor.  Small knot over distal joint of R index finger-slt tender Not red   Psychiatric: She has a normal mood and affect.          Assessment & Plan:

## 2014-01-12 NOTE — Patient Instructions (Signed)
Tdap vaccine today  Please do IFOB card for colon cancer screening  Schedule non fasting lab for 3 months to re check thyroid

## 2014-01-12 NOTE — Progress Notes (Signed)
Pre visit review using our clinic review tool, if applicable. No additional management support is needed unless otherwise documented below in the visit note. 

## 2014-01-14 NOTE — Assessment & Plan Note (Signed)
Reviewed health habits including diet and exercise and skin cancer prevention Reviewed appropriate screening tests for age  Also reviewed health mt list, fam hx and immunization status , as well as social and family history   Labs reviewed Pt declines flu shots

## 2014-01-14 NOTE — Assessment & Plan Note (Signed)
D/w patient [id number]:options for colon cancer screening, including IFOB vs. colonoscopy.  Risks and benefits of both were discussed and patient voiced understanding.  Pt elects for: Ifob card

## 2014-01-14 NOTE — Assessment & Plan Note (Signed)
No symptoms  Will re check with free T4 in the next 3 mo

## 2014-01-15 ENCOUNTER — Other Ambulatory Visit (INDEPENDENT_AMBULATORY_CARE_PROVIDER_SITE_OTHER): Payer: BC Managed Care – PPO

## 2014-01-15 DIAGNOSIS — Z1211 Encounter for screening for malignant neoplasm of colon: Secondary | ICD-10-CM

## 2014-01-15 LAB — FECAL OCCULT BLOOD, IMMUNOCHEMICAL: Fecal Occult Bld: NEGATIVE

## 2014-01-16 ENCOUNTER — Encounter: Payer: Self-pay | Admitting: *Deleted

## 2014-04-06 ENCOUNTER — Other Ambulatory Visit (INDEPENDENT_AMBULATORY_CARE_PROVIDER_SITE_OTHER): Payer: BC Managed Care – PPO

## 2014-04-06 DIAGNOSIS — R7989 Other specified abnormal findings of blood chemistry: Secondary | ICD-10-CM

## 2014-04-06 DIAGNOSIS — R946 Abnormal results of thyroid function studies: Secondary | ICD-10-CM

## 2014-04-06 LAB — T4, FREE: FREE T4: 0.66 ng/dL (ref 0.60–1.60)

## 2014-04-06 LAB — TSH: TSH: 7.89 u[IU]/mL — ABNORMAL HIGH (ref 0.35–4.50)

## 2014-04-13 ENCOUNTER — Ambulatory Visit (INDEPENDENT_AMBULATORY_CARE_PROVIDER_SITE_OTHER): Payer: BC Managed Care – PPO | Admitting: Family Medicine

## 2014-04-13 ENCOUNTER — Encounter: Payer: Self-pay | Admitting: Family Medicine

## 2014-04-13 VITALS — BP 126/86 | HR 56 | Temp 98.3°F | Ht 63.0 in | Wt 123.5 lb

## 2014-04-13 DIAGNOSIS — E038 Other specified hypothyroidism: Secondary | ICD-10-CM

## 2014-04-13 DIAGNOSIS — E039 Hypothyroidism, unspecified: Secondary | ICD-10-CM | POA: Insufficient documentation

## 2014-04-13 MED ORDER — LEVOTHYROXINE SODIUM 25 MCG PO TABS: 25.0000 ug | ORAL_TABLET | Freq: Every day | ORAL | Status: DC

## 2014-04-13 NOTE — Progress Notes (Signed)
Pre visit review using our clinic review tool, if applicable. No additional management support is needed unless otherwise documented below in the visit note. 

## 2014-04-13 NOTE — Patient Instructions (Signed)
Start levothyroxine as directed  Schedule non fasting labs in 6 weeks to re check this  If any problems let me know

## 2014-04-13 NOTE — Progress Notes (Signed)
Subjective:    Patient ID: Ruth Gill, female    DOB: 1961-05-24, 53 y.o.   MRN: [id number]  HPI Here for f/u of abn thyroid tests   Lab Results  Component Value Date   TSH 7.89* 04/06/2014   Free T4 was .66 (low nl)  Hypothyroidism does not run in family  Feels fine overall  Takes good care of herself  She gets tired in the afternoon  She does want to tx this   No enl in neck (no goiter noted or discomfort)  Patient Active Problem List   Diagnosis Date Noted  . Hypothyroid 04/13/2014  . Colon cancer screening 01/12/2014  . TSH elevation 01/12/2014  . Routine general medical examination at a health care facility 01/04/2014  . ALLERGIC RHINITIS 12/22/2007   Past Medical History  Diagnosis Date  . Mastodynia   . Anemia, unspecified   . Allergic rhinitis, cause unspecified    Past Surgical History  Procedure Laterality Date  . Abdominal hysterectomy     History  Substance Use Topics  . Smoking status: Never Smoker   . Smokeless tobacco: Not on file  . Alcohol Use: No   No family history on file. Allergies  Allergen Reactions  . Azithromycin     REACTION: joint swelling, stiffness  . Penicillins     REACTION: SOB, rash   Current Outpatient Prescriptions on File Prior to Visit  Medication Sig Dispense Refill  . estradiol (ESTRACE) 1 MG tablet Take 1 tablet by mouth daily.       . fexofenadine (ALLEGRA) 180 MG tablet Take 180 mg by mouth daily as needed.        . fluticasone (FLONASE) 50 MCG/ACT nasal spray Place 2 sprays into both nostrils daily as needed.  16 g  4   No current facility-administered medications on file prior to visit.    Review of Systems Review of Systems  Constitutional: Negative for fever, appetite change, fatigue and unexpected weight change.  Eyes: Negative for pain and visual disturbance.  Respiratory: Negative for cough and shortness of breath.   Cardiovascular: Negative for cp or palpitations    Gastrointestinal: Negative  for nausea, diarrhea and constipation.  Genitourinary: Negative for urgency and frequency.  Skin: Negative for pallor or rash   Neurological: Negative for weakness, light-headedness, numbness and headaches.  Hematological: Negative for adenopathy. Does not bruise/bleed easily.  Psychiatric/Behavioral: Negative for dysphoric mood. The patient is not nervous/anxious.         Objective:   Physical Exam  Constitutional: She appears well-developed and well-nourished. No distress.  HENT:  Head: Normocephalic.  Mouth/Throat: Oropharynx is clear and moist.  Eyes: Conjunctivae and EOM are normal. Pupils are equal, round, and reactive to light. No scleral icterus.  Neck: Normal range of motion. Neck supple.  Thyroid is prominent/ symmetric/nt and no thyroid bruit   Cardiovascular: Normal rate and regular rhythm.   Pulmonary/Chest: Effort normal and breath sounds normal.  Musculoskeletal: She exhibits no edema.  Lymphadenopathy:    She has no cervical adenopathy.  Neurological: She is alert. She has normal reflexes.  Skin: Skin is warm and dry. No rash noted. No erythema. No pallor.  Psychiatric: She has a normal mood and affect.          Assessment & Plan:   Problem List Items Addressed This Visit     Endocrine   Hypothyroid - Primary     Mild and not symptomatic  Pt still chooses to tx No  goiter Start levothyroxine 25 mcg (disc way to take and given handout) Re check lab in 6 wk Will update if problems     Relevant Medications      levothyroxine (SYNTHROID, LEVOTHROID) tablet   Other Relevant Orders      TSH

## 2014-04-13 NOTE — Assessment & Plan Note (Signed)
Mild and not symptomatic  Pt still chooses to tx No goiter Start levothyroxine 25 mcg (disc way to take and given handout) Re check lab in 6 wk Will update if problems

## 2014-05-25 ENCOUNTER — Other Ambulatory Visit (INDEPENDENT_AMBULATORY_CARE_PROVIDER_SITE_OTHER): Payer: BC Managed Care – PPO

## 2014-05-25 DIAGNOSIS — E038 Other specified hypothyroidism: Secondary | ICD-10-CM

## 2014-05-25 LAB — TSH: TSH: 2.63 u[IU]/mL (ref 0.35–4.50)

## 2014-05-29 ENCOUNTER — Encounter: Payer: Self-pay | Admitting: *Deleted

## 2014-08-12 ENCOUNTER — Other Ambulatory Visit: Payer: Self-pay | Admitting: Family Medicine

## 2014-08-15 ENCOUNTER — Other Ambulatory Visit: Payer: Self-pay | Admitting: Obstetrics and Gynecology

## 2014-08-16 ENCOUNTER — Other Ambulatory Visit: Payer: Self-pay | Admitting: Family Medicine

## 2014-08-16 LAB — CYTOLOGY - PAP

## 2014-08-22 LAB — HM MAMMOGRAPHY: HM Mammogram: NORMAL

## 2015-01-15 ENCOUNTER — Encounter: Payer: Self-pay | Admitting: Family Medicine

## 2015-01-15 ENCOUNTER — Ambulatory Visit (INDEPENDENT_AMBULATORY_CARE_PROVIDER_SITE_OTHER): Payer: BC Managed Care – PPO | Admitting: Family Medicine

## 2015-01-15 VITALS — BP 110/70 | HR 65 | Temp 98.3°F | Wt 130.0 lb

## 2015-01-15 DIAGNOSIS — E038 Other specified hypothyroidism: Secondary | ICD-10-CM | POA: Diagnosis not present

## 2015-01-15 LAB — TSH: TSH: 4.85 u[IU]/mL — ABNORMAL HIGH (ref 0.35–4.50)

## 2015-01-15 MED ORDER — FLUTICASONE PROPIONATE 50 MCG/ACT NA SUSP: 2.0000 | Freq: Every day | NASAL | Status: DC | PRN

## 2015-01-15 NOTE — Progress Notes (Signed)
Subjective:    Patient ID: Ruth Gill, female    DOB: 1961-03-23, 54 y.o.   MRN: [id number]  HPI Here for f/u of hypothyroidism   Lab Results  Component Value Date   TSH 2.63 05/25/2014    Is feeling ok  She is generally tired - due to schedule  Her hair loss has picked up a bit   Wt is up 7 lb as well even though - she is on 1200 cal per day with exercise  bmi is good at 23 however   Wants to do some strength training   She takes levothyroxine first thing in the am = not with any vitamins   Does not tolerate ca- constipation/ but will start vit D   Patient Active Problem List   Diagnosis Date Noted  . Hypothyroid 04/13/2014  . Colon cancer screening 01/12/2014  . TSH elevation 01/12/2014  . Routine general medical examination at a health care facility 01/04/2014  . ALLERGIC RHINITIS 12/22/2007   Past Medical History  Diagnosis Date  . Mastodynia   . Anemia, unspecified   . Allergic rhinitis, cause unspecified    Past Surgical History  Procedure Laterality Date  . Abdominal hysterectomy     History  Substance Use Topics  . Smoking status: Never Smoker   . Smokeless tobacco: Never Used  . Alcohol Use: No   No family history on file. Allergies  Allergen Reactions  . Azithromycin     REACTION: joint swelling, stiffness  . Penicillins     REACTION: SOB, rash   Current Outpatient Prescriptions on File Prior to Visit  Medication Sig Dispense Refill  . estradiol (ESTRACE) 1 MG tablet Take 1 tablet by mouth daily.     . fexofenadine (ALLEGRA) 180 MG tablet Take 180 mg by mouth daily as needed.      . fluticasone (FLONASE) 50 MCG/ACT nasal spray Place 2 sprays into both nostrils daily as needed. 16 g 4  . levothyroxine (SYNTHROID, LEVOTHROID) 25 MCG tablet TAKE 1 TABLET (25 MCG TOTAL) BY MOUTH DAILY BEFORE BREAKFAST. 30 tablet 10   No current facility-administered medications on file prior to visit.    Review of Systems Review of Systems    Constitutional: Negative for fever, appetite change, and unexpected weight change.  Eyes: Negative for pain and visual disturbance.  Respiratory: Negative for cough and shortness of breath.   Cardiovascular: Negative for cp or palpitations    Gastrointestinal: Negative for nausea, diarrhea and constipation.  Genitourinary: Negative for urgency and frequency.  Skin: Negative for pallor or rash  pos for some iffuse hair loss  Neurological: Negative for weakness, light-headedness, numbness and headaches.  Hematological: Negative for adenopathy. Does not bruise/bleed easily.  Psychiatric/Behavioral: Negative for dysphoric mood. The patient is not nervous/anxious.         Objective:   Physical Exam  Constitutional: She appears well-developed and well-nourished. No distress.  HENT:  Head: Normocephalic and atraumatic.  Mouth/Throat: Oropharynx is clear and moist.  Eyes: Conjunctivae and EOM are normal. Pupils are equal, round, and reactive to light.  Neck: Normal range of motion. Neck supple. No JVD present. Carotid bruit is not present. No thyromegaly present.  Cardiovascular: Normal rate, regular rhythm, normal heart sounds and intact distal pulses.  Exam reveals no gallop.   Pulmonary/Chest: Effort normal and breath sounds normal. No respiratory distress. She has no wheezes. She has no rales.  No crackles  Abdominal: Soft. Bowel sounds are normal. She exhibits no distension,  no abdominal bruit and no mass. There is no tenderness.  Musculoskeletal: She exhibits no edema.  Lymphadenopathy:    She has no cervical adenopathy.  Neurological: She is alert. She has normal reflexes.  Skin: Skin is warm and dry. No rash noted.  No focal allopecia   Psychiatric: She has a normal mood and affect.          Assessment & Plan:   Problem List Items Addressed This Visit      Endocrine   Hypothyroid - Primary    tsh today Lab Results  Component Value Date   TSH 2.63 05/25/2014    Pt  has some hair loss and wt gain Will change dose if needed Nl exam       Relevant Orders   TSH (Completed)

## 2015-01-15 NOTE — Patient Instructions (Signed)
Labs today  Keep up the good work with lifestyle habits  Goal for vitamin D - 1000-2000 iu daily  For multi vitamin- try [employer]' childrens chewable (without iron) - one daily

## 2015-01-15 NOTE — Assessment & Plan Note (Signed)
tsh today Lab Results  Component Value Date   TSH 2.63 05/25/2014    Pt has some hair loss and wt gain Will change dose if needed Nl exam

## 2015-01-16 ENCOUNTER — Telehealth: Payer: Self-pay | Admitting: Family Medicine

## 2015-01-16 MED ORDER — LEVOTHYROXINE SODIUM 50 MCG PO TABS: 50.0000 ug | ORAL_TABLET | Freq: Every day | ORAL | Status: DC

## 2015-01-16 NOTE — Telephone Encounter (Signed)
I need to increase her thyroid dose  Please call in  Re check tsh in 6 wk please for hypothyroidism

## 2015-01-16 NOTE — Telephone Encounter (Signed)
Rx sent to pharmacy, pt notified of lab results and Dr. Royden Purlower's comments, and lab appt scheduled for 03/04/15

## 2015-02-27 ENCOUNTER — Other Ambulatory Visit: Payer: Self-pay | Admitting: Family Medicine

## 2015-02-27 DIAGNOSIS — E038 Other specified hypothyroidism: Secondary | ICD-10-CM

## 2015-03-04 ENCOUNTER — Telehealth: Payer: Self-pay | Admitting: Family Medicine

## 2015-03-04 ENCOUNTER — Other Ambulatory Visit (INDEPENDENT_AMBULATORY_CARE_PROVIDER_SITE_OTHER): Payer: BC Managed Care – PPO

## 2015-03-04 DIAGNOSIS — E038 Other specified hypothyroidism: Secondary | ICD-10-CM

## 2015-03-04 LAB — TSH: TSH: 5.3 u[IU]/mL — ABNORMAL HIGH (ref 0.35–4.50)

## 2015-03-04 MED ORDER — LEVOTHYROXINE SODIUM 75 MCG PO TABS: 75.0000 ug | ORAL_TABLET | Freq: Every day | ORAL | Status: DC

## 2015-03-04 NOTE — Telephone Encounter (Signed)
I want to increase her dose of levothyroxine Current 50 will inc to 75 Re check tsh in 6 wk please for hypothyroidism  Thanks -please call in

## 2015-03-05 MED ORDER — LEVOTHYROXINE SODIUM 75 MCG PO TABS: 75.0000 ug | ORAL_TABLET | Freq: Every day | ORAL | Status: DC

## 2015-03-05 NOTE — Telephone Encounter (Signed)
Rx sent to pharmacy. Pt notified of labs and Dr. Royden Purlower's comments. Lab appt scheduled for 04/17/15

## 2015-04-10 ENCOUNTER — Other Ambulatory Visit: Payer: Self-pay | Admitting: Family Medicine

## 2015-04-10 DIAGNOSIS — E038 Other specified hypothyroidism: Secondary | ICD-10-CM

## 2015-04-17 ENCOUNTER — Other Ambulatory Visit (INDEPENDENT_AMBULATORY_CARE_PROVIDER_SITE_OTHER): Payer: BC Managed Care – PPO

## 2015-04-17 DIAGNOSIS — E038 Other specified hypothyroidism: Secondary | ICD-10-CM

## 2015-04-17 LAB — TSH: TSH: 2.72 u[IU]/mL (ref 0.35–4.50)

## 2015-04-18 ENCOUNTER — Encounter: Payer: Self-pay | Admitting: *Deleted

## 2015-05-02 ENCOUNTER — Encounter: Payer: Self-pay | Admitting: *Deleted

## 2015-07-01 ENCOUNTER — Other Ambulatory Visit: Payer: Self-pay | Admitting: Family Medicine

## 2015-07-14 ENCOUNTER — Telehealth: Payer: Self-pay | Admitting: Family Medicine

## 2015-07-14 DIAGNOSIS — Z0000 Encounter for general adult medical examination without abnormal findings: Secondary | ICD-10-CM

## 2015-07-14 NOTE — Telephone Encounter (Signed)
-----   Message from Alvina Chouerri J Walsh sent at 07/09/2015  4:17 PM EDT ----- Regarding: Lab orders for Monday, 10.17.16 Patient is scheduled for CPX labs, please order future labs, Thanks , Camelia Engerri

## 2015-07-15 ENCOUNTER — Other Ambulatory Visit (INDEPENDENT_AMBULATORY_CARE_PROVIDER_SITE_OTHER): Payer: BC Managed Care – PPO

## 2015-07-15 DIAGNOSIS — Z0000 Encounter for general adult medical examination without abnormal findings: Secondary | ICD-10-CM | POA: Diagnosis not present

## 2015-07-15 LAB — COMPREHENSIVE METABOLIC PANEL
ALK PHOS: 45 U/L (ref 39–117)
ALT: 14 U/L (ref 0–35)
AST: 17 U/L (ref 0–37)
Albumin: 4.1 g/dL (ref 3.5–5.2)
BILIRUBIN TOTAL: 0.4 mg/dL (ref 0.2–1.2)
BUN: 16 mg/dL (ref 6–23)
CO2: 33 meq/L — AB (ref 19–32)
CREATININE: 0.74 mg/dL (ref 0.40–1.20)
Calcium: 9.6 mg/dL (ref 8.4–10.5)
Chloride: 104 mEq/L (ref 96–112)
GFR: 86.32 mL/min (ref >60.00)
GLUCOSE: 82 mg/dL (ref 70–99)
Potassium: 4.2 mEq/L (ref 3.5–5.1)
Sodium: 142 mEq/L (ref 135–145)
TOTAL PROTEIN: 7.1 g/dL (ref 6.0–8.3)

## 2015-07-15 LAB — CBC WITH DIFFERENTIAL/PLATELET
BASOS ABS: 0 10*3/uL (ref 0.0–0.1)
Basophils Relative: 0.8 % (ref 0.0–3.0)
EOS PCT: 5 % (ref 0.0–5.0)
Eosinophils Absolute: 0.2 10*3/uL (ref 0.0–0.7)
HCT: 40.1 % (ref 36.0–46.0)
HEMOGLOBIN: 13.6 g/dL (ref 12.0–15.0)
LYMPHS PCT: 43.9 % (ref 12.0–46.0)
Lymphs Abs: 2.1 10*3/uL (ref 0.7–4.0)
MCHC: 33.9 g/dL (ref 30.0–36.0)
MCV: 92.3 fl (ref 78.0–100.0)
MONOS PCT: 7.9 % (ref 3.0–12.0)
Monocytes Absolute: 0.4 10*3/uL (ref 0.1–1.0)
Neutro Abs: 2 10*3/uL (ref 1.4–7.7)
Neutrophils Relative %: 42.4 % — ABNORMAL LOW (ref 43.0–77.0)
Platelets: 195 10*3/uL (ref 150.0–400.0)
RBC: 4.35 Mil/uL (ref 3.87–5.11)
RDW: 12.2 % (ref 11.5–15.5)
WBC: 4.8 10*3/uL (ref 4.0–10.5)

## 2015-07-15 LAB — LIPID PANEL
CHOL/HDL RATIO: 3
Cholesterol: 196 mg/dL (ref 0–200)
HDL: 62.6 mg/dL (ref >39.00)
LDL Cholesterol: 116 mg/dL — ABNORMAL HIGH (ref 0–99)
NONHDL: 133.18
Triglycerides: 87 mg/dL (ref 0.0–149.0)
VLDL: 17.4 mg/dL (ref 0.0–40.0)

## 2015-07-15 LAB — TSH: TSH: 1.63 u[IU]/mL (ref 0.35–4.50)

## 2015-07-22 ENCOUNTER — Ambulatory Visit (INDEPENDENT_AMBULATORY_CARE_PROVIDER_SITE_OTHER): Payer: BC Managed Care – PPO | Admitting: Family Medicine

## 2015-07-22 ENCOUNTER — Encounter: Payer: Self-pay | Admitting: Family Medicine

## 2015-07-22 VITALS — BP 136/88 | HR 59 | Temp 98.4°F | Ht 62.75 in | Wt 133.5 lb

## 2015-07-22 DIAGNOSIS — Z1211 Encounter for screening for malignant neoplasm of colon: Secondary | ICD-10-CM

## 2015-07-22 DIAGNOSIS — E038 Other specified hypothyroidism: Secondary | ICD-10-CM | POA: Diagnosis not present

## 2015-07-22 DIAGNOSIS — Z0000 Encounter for general adult medical examination without abnormal findings: Secondary | ICD-10-CM | POA: Diagnosis not present

## 2015-07-22 NOTE — Assessment & Plan Note (Signed)
Hypothyroidism  Pt has no clinical changes No change in energy level/ hair or skin/ edema and no tremor Lab Results  Component Value Date   TSH 1.63 07/15/2015

## 2015-07-22 NOTE — Assessment & Plan Note (Signed)
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

## 2015-07-22 NOTE — Patient Instructions (Signed)
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

## 2015-07-22 NOTE — Assessment & Plan Note (Signed)
Pt declines colonoscopy Given IFOB kit  No symptoms

## 2015-07-22 NOTE — Progress Notes (Signed)
Subjective:    Patient ID: Ruth Gill, female    DOB: 1961/03/29, 54 y.o.   MRN: [id number]  HPI Here for health maintenance exam and to review chronic medical problems    Feeling fine overall  Nothing new going on healthwise   Has not been to the gym in several mo  A lot of family stress/ taking care of MIL (weekends and some else)  bp is higher than usual  BP Readings from Last 3 Encounters:  07/22/15 136/88  01/15/15 110/70  04/13/14 126/86    She started back to the gym today    Wt is up 2-3 lb with bmi of 23 Still eats a healthy diet   Hep C/HIV screen Declines/ not high risk   Colon cancer screen   IFOB 4/15 Not ready for colonoscopy yet    Flu shot - does not get .she declines   Mm 11/15 per pt was normal (will get in Dec) Self exam-no lumps   Goes to gyn  Has appt 12/19  Had partial hyst Pap 11/15 nl   Td 4/15  Hypothyroidism  Pt has no clinical changes No change in energy level/ hair or skin/ edema and no tremor Lab Results  Component Value Date   TSH 1.63 07/15/2015     Results for orders placed or performed in visit on 07/15/15  CBC with Differential/Platelet  Result Value Ref Range   WBC 4.8 4.0 - 10.5 K/uL   RBC 4.35 3.87 - 5.11 Mil/uL   Hemoglobin 13.6 12.0 - 15.0 g/dL   HCT 40.1 36.0 - 46.0 %   MCV 92.3 78.0 - 100.0 fl   MCHC 33.9 30.0 - 36.0 g/dL   RDW 12.2 11.5 - 15.5 %   Platelets 195.0 150.0 - 400.0 K/uL   Neutrophils Relative % 42.4 (L) 43.0 - 77.0 %   Lymphocytes Relative 43.9 12.0 - 46.0 %   Monocytes Relative 7.9 3.0 - 12.0 %   Eosinophils Relative 5.0 0.0 - 5.0 %   Basophils Relative 0.8 0.0 - 3.0 %   Neutro Abs 2.0 1.4 - 7.7 K/uL   Lymphs Abs 2.1 0.7 - 4.0 K/uL   Monocytes Absolute 0.4 0.1 - 1.0 K/uL   Eosinophils Absolute 0.2 0.0 - 0.7 K/uL   Basophils Absolute 0.0 0.0 - 0.1 K/uL  Comprehensive metabolic panel  Result Value Ref Range   Sodium 142 135 - 145 mEq/L   Potassium 4.2 3.5 - 5.1 mEq/L   Chloride 104  96 - 112 mEq/L   CO2 33 (H) 19 - 32 mEq/L   Glucose, Bld 82 70 - 99 mg/dL   BUN 16 6 - 23 mg/dL   Creatinine, Ser 0.74 0.40 - 1.20 mg/dL   Total Bilirubin 0.4 0.2 - 1.2 mg/dL   Alkaline Phosphatase 45 39 - 117 U/L   AST 17 0 - 37 U/L   ALT 14 0 - 35 U/L   Total Protein 7.1 6.0 - 8.3 g/dL   Albumin 4.1 3.5 - 5.2 g/dL   Calcium 9.6 8.4 - 10.5 mg/dL   GFR 86.32 >60.00 mL/min  Lipid panel  Result Value Ref Range   Cholesterol 196 0 - 200 mg/dL   Triglycerides 87.0 0.0 - 149.0 mg/dL   HDL 62.60 >39.00 mg/dL   VLDL 17.4 0.0 - 40.0 mg/dL   LDL Cholesterol 116 (H) 0 - 99 mg/dL   Total CHOL/HDL Ratio 3    NonHDL 133.18   TSH  Result Value Ref  Range   TSH 1.63 0.35 - 4.50 uIU/mL    Patient Active Problem List   Diagnosis Date Noted  . Hypothyroid 04/13/2014  . Colon cancer screening 01/12/2014  . TSH elevation 01/12/2014  . Routine general medical examination at a health care facility 01/04/2014  . ALLERGIC RHINITIS 12/22/2007   Past Medical History  Diagnosis Date  . Mastodynia   . Anemia, unspecified   . Allergic rhinitis, cause unspecified    Past Surgical History  Procedure Laterality Date  . Abdominal hysterectomy     Social History  Substance Use Topics  . Smoking status: Never Smoker   . Smokeless tobacco: Never Used  . Alcohol Use: No   No family history on file. Allergies  Allergen Reactions  . Azithromycin     REACTION: joint swelling, stiffness  . Penicillins     REACTION: SOB, rash   Current Outpatient Prescriptions on File Prior to Visit  Medication Sig Dispense Refill  . estradiol (ESTRACE) 1 MG tablet Take 1 tablet by mouth daily.     . fexofenadine (ALLEGRA) 180 MG tablet Take 180 mg by mouth daily as needed.      . fluticasone (FLONASE) 50 MCG/ACT nasal spray Place 2 sprays into both nostrils daily as needed. 16 g 11  . levothyroxine (SYNTHROID, LEVOTHROID) 75 MCG tablet TAKE 1 TABLET (75 MCG TOTAL) BY MOUTH DAILY. 30 tablet 0  . Omega-3 Fatty  Acids (FISH OIL) 1200 MG CAPS Take 1 capsule by mouth daily.    Marland Kitchen VAGIFEM 10 MCG TABS vaginal tablet Place 1 tablet vaginally. Once daily for 14 days  11   No current facility-administered medications on file prior to visit.    Review of Systems    Review of Systems  Constitutional: Negative for fever, appetite change, fatigue and unexpected weight change.  Eyes: Negative for pain and visual disturbance.  Respiratory: Negative for cough and shortness of breath.   Cardiovascular: Negative for cp or palpitations    Gastrointestinal: Negative for nausea, diarrhea and constipation.  Genitourinary: Negative for urgency and frequency.  Skin: Negative for pallor or rash   Neurological: Negative for weakness, light-headedness, numbness and headaches.  Hematological: Negative for adenopathy. Does not bruise/bleed easily.  Psychiatric/Behavioral: Negative for dysphoric mood. The patient is not nervous/anxious.      Objective:   Physical Exam  Constitutional: She appears well-developed and well-nourished. No distress.  Well appearing   HENT:  Head: Normocephalic and atraumatic.  Right Ear: External ear normal.  Left Ear: External ear normal.  Nose: Nose normal.  Mouth/Throat: Oropharynx is clear and moist.  Eyes: Conjunctivae and EOM are normal. Pupils are equal, round, and reactive to light. Right eye exhibits no discharge. Left eye exhibits no discharge. No scleral icterus.  Neck: Normal range of motion. Neck supple. No JVD present. Carotid bruit is not present. No thyromegaly present.  Cardiovascular: Normal rate, regular rhythm, normal heart sounds and intact distal pulses.  Exam reveals no gallop.   Pulmonary/Chest: Effort normal and breath sounds normal. No respiratory distress. She has no wheezes. She has no rales.  Abdominal: Soft. Bowel sounds are normal. She exhibits no distension and no mass. There is no tenderness.  Musculoskeletal: She exhibits no edema or tenderness.    Lymphadenopathy:    She has no cervical adenopathy.  Neurological: She is alert. She has normal reflexes. No cranial nerve deficit. She exhibits normal muscle tone. Coordination normal.  Skin: Skin is warm and dry. No rash noted. No erythema.  No pallor.  Solar lentigo diffusely   Psychiatric: She has a normal mood and affect.          Assessment & Plan:   Problem List Items Addressed This Visit      Endocrine   Hypothyroid    Hypothyroidism  Pt has no clinical changes No change in energy level/ hair or skin/ edema and no tremor Lab Results  Component Value Date   TSH 1.63 07/15/2015            Other   Colon cancer screening    Pt declines colonoscopy Given IFOB kit  No symptoms       Relevant Orders   Fecal occult blood, imunochemical   Routine general medical examination at a health care facility - Primary    Reviewed health habits including diet and exercise and skin cancer prevention Reviewed appropriate screening tests for age  Also reviewed health mt list, fam hx and immunization status , as well as social and family history   See HPI Labs reviewed You may want to consider a flu shot since you are care giving  Take care of yourself  Get back to the gym  Please do stool card for colon cancer screening

## 2015-07-22 NOTE — Progress Notes (Signed)
Pre visit review using our clinic review tool, if applicable. No additional management support is needed unless otherwise documented below in the visit note. 

## 2015-07-31 ENCOUNTER — Other Ambulatory Visit: Payer: Self-pay | Admitting: Family Medicine

## 2015-08-05 ENCOUNTER — Other Ambulatory Visit (INDEPENDENT_AMBULATORY_CARE_PROVIDER_SITE_OTHER): Payer: BC Managed Care – PPO

## 2015-08-05 DIAGNOSIS — Z1211 Encounter for screening for malignant neoplasm of colon: Secondary | ICD-10-CM

## 2015-08-05 LAB — FECAL OCCULT BLOOD, IMMUNOCHEMICAL: FECAL OCCULT BLD: NEGATIVE

## 2015-08-05 LAB — FECAL OCCULT BLOOD, GUAIAC: FECAL OCCULT BLD: NEGATIVE

## 2015-08-06 ENCOUNTER — Encounter: Payer: Self-pay | Admitting: Family Medicine

## 2015-08-06 ENCOUNTER — Encounter: Payer: Self-pay | Admitting: *Deleted

## 2015-09-16 LAB — HM MAMMOGRAPHY: HM MAMMO: NORMAL (ref 0–4)

## 2016-05-14 ENCOUNTER — Telehealth: Payer: Self-pay

## 2016-05-14 NOTE — Telephone Encounter (Signed)
Pt left v/m requesting cb; pt wants to know if upto date on tdap; last tdap per immunization record was 01/12/14. Left v/m requesting pt to cb.

## 2016-06-03 ENCOUNTER — Other Ambulatory Visit: Payer: Self-pay | Admitting: Family Medicine

## 2016-07-26 ENCOUNTER — Telehealth: Payer: Self-pay | Admitting: Family Medicine

## 2016-07-26 DIAGNOSIS — Z0000 Encounter for general adult medical examination without abnormal findings: Secondary | ICD-10-CM

## 2016-07-26 NOTE — Telephone Encounter (Signed)
-----   Message from Alvina Chouerri J Walsh sent at 07/21/2016 10:11 AM EDT ----- Regarding: Lab orders for Wednesday, 11.1.17 Patient is scheduled for CPX labs, please order future labs, Thanks , Camelia Engerri'

## 2016-07-29 ENCOUNTER — Other Ambulatory Visit (INDEPENDENT_AMBULATORY_CARE_PROVIDER_SITE_OTHER): Payer: BC Managed Care – PPO

## 2016-07-29 DIAGNOSIS — Z0000 Encounter for general adult medical examination without abnormal findings: Secondary | ICD-10-CM | POA: Diagnosis not present

## 2016-07-29 LAB — CBC WITH DIFFERENTIAL/PLATELET
BASOS ABS: 0 10*3/uL (ref 0.0–0.1)
Basophils Relative: 0.7 % (ref 0.0–3.0)
EOS ABS: 0.2 10*3/uL (ref 0.0–0.7)
Eosinophils Relative: 4.5 % (ref 0.0–5.0)
HCT: 39.1 % (ref 36.0–46.0)
Hemoglobin: 13.3 g/dL (ref 12.0–15.0)
LYMPHS ABS: 2 10*3/uL (ref 0.7–4.0)
Lymphocytes Relative: 41.2 % (ref 12.0–46.0)
MCHC: 34 g/dL (ref 30.0–36.0)
MCV: 92 fl (ref 78.0–100.0)
Monocytes Absolute: 0.4 10*3/uL (ref 0.1–1.0)
Monocytes Relative: 8 % (ref 3.0–12.0)
NEUTROS ABS: 2.2 10*3/uL (ref 1.4–7.7)
Neutrophils Relative %: 45.6 % (ref 43.0–77.0)
PLATELETS: 193 10*3/uL (ref 150.0–400.0)
RBC: 4.25 Mil/uL (ref 3.87–5.11)
RDW: 11.9 % (ref 11.5–15.5)
WBC: 4.9 10*3/uL (ref 4.0–10.5)

## 2016-07-29 LAB — COMPREHENSIVE METABOLIC PANEL
ALBUMIN: 4.3 g/dL (ref 3.5–5.2)
ALT: 13 U/L (ref 0–35)
AST: 19 U/L (ref 0–37)
Alkaline Phosphatase: 47 U/L (ref 39–117)
BUN: 21 mg/dL (ref 6–23)
CHLORIDE: 104 meq/L (ref 96–112)
CO2: 31 meq/L (ref 19–32)
Calcium: 9.5 mg/dL (ref 8.4–10.5)
Creatinine, Ser: 0.78 mg/dL (ref 0.40–1.20)
GFR: 80.93 mL/min (ref >60.00)
Glucose, Bld: 89 mg/dL (ref 70–99)
Potassium: 4 mEq/L (ref 3.5–5.1)
Sodium: 141 mEq/L (ref 135–145)
Total Bilirubin: 0.4 mg/dL (ref 0.2–1.2)
Total Protein: 7.2 g/dL (ref 6.0–8.3)

## 2016-07-29 LAB — LIPID PANEL
CHOL/HDL RATIO: 3
CHOLESTEROL: 199 mg/dL (ref 0–200)
HDL: 65.2 mg/dL (ref >39.00)
LDL CALC: 118 mg/dL — AB (ref 0–99)
NonHDL: 133.97
TRIGLYCERIDES: 78 mg/dL (ref 0.0–149.0)
VLDL: 15.6 mg/dL (ref 0.0–40.0)

## 2016-07-29 LAB — TSH: TSH: 1.9 u[IU]/mL (ref 0.35–4.50)

## 2016-07-29 NOTE — Telephone Encounter (Signed)
I spoke with pt and gave last tdap per immunization list as 01/12/14. Pt voiced understanding.

## 2016-08-04 ENCOUNTER — Ambulatory Visit (INDEPENDENT_AMBULATORY_CARE_PROVIDER_SITE_OTHER): Payer: BC Managed Care – PPO | Admitting: Family Medicine

## 2016-08-04 ENCOUNTER — Encounter: Payer: Self-pay | Admitting: Family Medicine

## 2016-08-04 VITALS — BP 120/70 | HR 54 | Temp 98.5°F | Ht 62.5 in | Wt 136.0 lb

## 2016-08-04 DIAGNOSIS — Z0000 Encounter for general adult medical examination without abnormal findings: Secondary | ICD-10-CM

## 2016-08-04 DIAGNOSIS — Z1211 Encounter for screening for malignant neoplasm of colon: Secondary | ICD-10-CM

## 2016-08-04 DIAGNOSIS — E038 Other specified hypothyroidism: Secondary | ICD-10-CM

## 2016-08-04 DIAGNOSIS — Z23 Encounter for immunization: Secondary | ICD-10-CM | POA: Diagnosis not present

## 2016-08-04 DIAGNOSIS — R030 Elevated blood-pressure reading, without diagnosis of hypertension: Secondary | ICD-10-CM | POA: Insufficient documentation

## 2016-08-04 MED ORDER — LEVOTHYROXINE SODIUM 75 MCG PO TABS: ORAL_TABLET | ORAL | 11 refills | Status: DC

## 2016-08-04 NOTE — Progress Notes (Signed)
Pre visit review using our clinic review tool, if applicable. No additional management support is needed unless otherwise documented below in the visit note. 

## 2016-08-04 NOTE — Assessment & Plan Note (Signed)
Reviewed health habits including diet and exercise and skin cancer prevention Reviewed appropriate screening tests for age  Also reviewed health mt list, fam hx and immunization status , as well as social and family history   See HPI Labs reviewed  Flu vaccine today  Pt will call to schedule a colonoscopy in the summer utd gyn care with gyn  On D3 for bone health Urged to keep exercising

## 2016-08-04 NOTE — Assessment & Plan Note (Signed)
This corrected on re check to 120/70

## 2016-08-04 NOTE — Patient Instructions (Addendum)
Call us in the late spring to schedule a colonoscopy for summer  Continue your vitamin D Labs are stable  Keep exercising  Flu shot  Blood pressure is great on the 2nd check  We will keep an eye on it

## 2016-08-04 NOTE — Assessment & Plan Note (Signed)
Hypothyroidism  Pt has no clinical changes No change in energy level/ hair or skin/ edema and no tremor Lab Results  Component Value Date   TSH 1.90 07/29/2016

## 2016-08-04 NOTE — Assessment & Plan Note (Signed)
Pt wants to get a colonoscopy in the summer-she will call in late spring to schedule that Last ifob kit was neg

## 2016-08-04 NOTE — Progress Notes (Signed)
Subjective:    Patient ID: Ruth Gill, female    DOB: March 05, 1961, 55 y.o.   MRN: [id number]  HPI Here for health maintenance exam and to review chronic medical problems   Doing well  Has a new grandchild 100 weeks old Another on the way   Her MIL passed away after caring for her    Wt Readings from Last 3 Encounters:  08/04/16 136 lb (61.7 kg)  07/22/15 133 lb 8 oz (60.6 kg)  01/15/15 130 lb (59 kg)  she feels like clothes are tighter  bmi 24.4 She avg gym 4 times week (until recently)  She pays close attn to diet and water intake    Colon cancer screening  ifob 11/16 negative  No colonoscopy yet  Is considering a colonoscopy this summer    Flu shot -wants to get today  Caring for a baby     Mammogram 12/16 with gyn -normal  Self breast exam  Pap 11/15-neg with neg HPV Partial hysterectomy Sees Dr Harrington Challenger Visit 12/16  Tetanus shot 4/15  BP Readings from Last 3 Encounters:  08/04/16 140/76  07/22/15 136/88  01/15/15 110/70  she does take estrace one pill daily  No one in family has HTN  She does exercise  Eats healthy  Better on re check BP: 120/70    Hypothyroidism  Pt has no clinical changes No change in energy level/ hair or skin/ edema and no tremor Lab Results  Component Value Date   TSH 1.90 07/29/2016     Results for orders placed or performed in visit on 07/29/16  CBC with Differential/Platelet  Result Value Ref Range   WBC 4.9 4.0 - 10.5 K/uL   RBC 4.25 3.87 - 5.11 Mil/uL   Hemoglobin 13.3 12.0 - 15.0 g/dL   HCT 39.1 36.0 - 46.0 %   MCV 92.0 78.0 - 100.0 fl   MCHC 34.0 30.0 - 36.0 g/dL   RDW 11.9 11.5 - 15.5 %   Platelets 193.0 150.0 - 400.0 K/uL   Neutrophils Relative % 45.6 43.0 - 77.0 %   Lymphocytes Relative 41.2 12.0 - 46.0 %   Monocytes Relative 8.0 3.0 - 12.0 %   Eosinophils Relative 4.5 0.0 - 5.0 %   Basophils Relative 0.7 0.0 - 3.0 %   Neutro Abs 2.2 1.4 - 7.7 K/uL   Lymphs Abs 2.0 0.7 - 4.0 K/uL   Monocytes  Absolute 0.4 0.1 - 1.0 K/uL   Eosinophils Absolute 0.2 0.0 - 0.7 K/uL   Basophils Absolute 0.0 0.0 - 0.1 K/uL  Comprehensive metabolic panel  Result Value Ref Range   Sodium 141 135 - 145 mEq/L   Potassium 4.0 3.5 - 5.1 mEq/L   Chloride 104 96 - 112 mEq/L   CO2 31 19 - 32 mEq/L   Glucose, Bld 89 70 - 99 mg/dL   BUN 21 6 - 23 mg/dL   Creatinine, Ser 0.78 0.40 - 1.20 mg/dL   Total Bilirubin 0.4 0.2 - 1.2 mg/dL   Alkaline Phosphatase 47 39 - 117 U/L   AST 19 0 - 37 U/L   ALT 13 0 - 35 U/L   Total Protein 7.2 6.0 - 8.3 g/dL   Albumin 4.3 3.5 - 5.2 g/dL   Calcium 9.5 8.4 - 10.5 mg/dL   GFR 80.93 >60.00 mL/min  Lipid panel  Result Value Ref Range   Cholesterol 199 0 - 200 mg/dL   Triglycerides 78.0 0.0 - 149.0 mg/dL   HDL 65.20 >  39.00 mg/dL   VLDL 15.6 0.0 - 40.0 mg/dL   LDL Cholesterol 118 (H) 0 - 99 mg/dL   Total CHOL/HDL Ratio 3    NonHDL 133.97   TSH  Result Value Ref Range   TSH 1.90 0.35 - 4.50 uIU/mL    Labs look good  Takes D3 for bones   Patient Active Problem List   Diagnosis Date Noted  . Hypothyroid 04/13/2014  . Colon cancer screening 01/12/2014  . Routine general medical examination at a health care facility 01/04/2014  . ALLERGIC RHINITIS 12/22/2007   Past Medical History:  Diagnosis Date  . Allergic rhinitis, cause unspecified   . Anemia, unspecified   . Mastodynia    Past Surgical History:  Procedure Laterality Date  . ABDOMINAL HYSTERECTOMY     Social History  Substance Use Topics  . Smoking status: Never Smoker  . Smokeless tobacco: Never Used  . Alcohol use No   No family history on file. Allergies  Allergen Reactions  . Azithromycin     REACTION: joint swelling, stiffness  . Penicillins     REACTION: SOB, rash   Current Outpatient Prescriptions on File Prior to Visit  Medication Sig Dispense Refill  . Biotin 5000 MCG CAPS Take 1 capsule by mouth daily.    . Cholecalciferol (VITAMIN D) 2000 UNITS tablet Take 4,000 Units by mouth  daily.     Marland Kitchen estradiol (ESTRACE) 1 MG tablet Take 1 tablet by mouth daily.     . fexofenadine (ALLEGRA) 180 MG tablet Take 180 mg by mouth daily as needed.      . flintstones complete (FLINTSTONES) 60 MG chewable tablet Chew 1 tablet by mouth daily.    . fluticasone (FLONASE) 50 MCG/ACT nasal spray Place 2 sprays into both nostrils daily as needed. 16 g 11  . Omega-3 Fatty Acids (FISH OIL) 1200 MG CAPS Take 1 capsule by mouth daily.    Marland Kitchen VAGIFEM 10 MCG TABS vaginal tablet Place 1 tablet vaginally 2 (two) times a week. Once daily for 14 days  11   No current facility-administered medications on file prior to visit.     Review of Systems Review of Systems  Constitutional: Negative for fever, appetite change, fatigue and unexpected weight change.  Eyes: Negative for pain and visual disturbance.  Respiratory: Negative for cough and shortness of breath.   Cardiovascular: Negative for cp or palpitations    Gastrointestinal: Negative for nausea, diarrhea and constipation.  Genitourinary: Negative for urgency and frequency.  Skin: Negative for pallor or rash   Neurological: Negative for weakness, light-headedness, numbness and headaches.  Hematological: Negative for adenopathy. Does not bruise/bleed easily.  Psychiatric/Behavioral: Negative for dysphoric mood. The patient is not nervous/anxious.         Objective:   Physical Exam  Constitutional: She appears well-developed and well-nourished. No distress.  Well appearing   HENT:  Head: Normocephalic and atraumatic.  Right Ear: External ear normal.  Left Ear: External ear normal.  Nose: Nose normal.  Mouth/Throat: Oropharynx is clear and moist.  Eyes: Conjunctivae and EOM are normal. Pupils are equal, round, and reactive to light. Right eye exhibits no discharge. Left eye exhibits no discharge. No scleral icterus.  Neck: Normal range of motion. Neck supple. No JVD present. Carotid bruit is not present. No thyromegaly present.    Cardiovascular: Normal rate, regular rhythm, normal heart sounds and intact distal pulses.  Exam reveals no gallop.   Pulmonary/Chest: Effort normal and breath sounds normal. No respiratory distress.  She has no wheezes. She has no rales.  Abdominal: Soft. Bowel sounds are normal. She exhibits no distension and no mass. There is no tenderness.  Genitourinary:  Genitourinary Comments: Gyn exam done by Dr Harrington Challenger  Musculoskeletal: She exhibits no edema or tenderness.  Lymphadenopathy:    She has no cervical adenopathy.  Neurological: She is alert. She has normal reflexes. No cranial nerve deficit. She exhibits normal muscle tone. Coordination normal.  Skin: Skin is warm and dry. No rash noted. No erythema. No pallor.  Lentigines diffusely  Some small angiomas   Small dermatofibroma with scab L lower leg  Psychiatric: She has a normal mood and affect.          Assessment & Plan:   Problem List Items Addressed This Visit      Endocrine   Hypothyroid    Hypothyroidism  Pt has no clinical changes No change in energy level/ hair or skin/ edema and no tremor Lab Results  Component Value Date   TSH 1.90 07/29/2016          Relevant Medications   levothyroxine (SYNTHROID, LEVOTHROID) 75 MCG tablet     Other   Colon cancer screening    Pt wants to get a colonoscopy in the summer-she will call in late spring to schedule that Last ifob kit was neg       RESOLVED: Elevated blood pressure reading    This corrected on re check to 120/70       Routine general medical examination at a health care facility - Primary    Reviewed health habits including diet and exercise and skin cancer prevention Reviewed appropriate screening tests for age  Also reviewed health mt list, fam hx and immunization status , as well as social and family history   See HPI Labs reviewed  Flu vaccine today  Pt will call to schedule a colonoscopy in the summer utd gyn care with gyn  On D3 for bone  health Urged to keep exercising

## 2016-09-02 ENCOUNTER — Other Ambulatory Visit: Payer: Self-pay | Admitting: Family Medicine

## 2017-05-04 ENCOUNTER — Telehealth: Payer: Self-pay | Admitting: Family Medicine

## 2017-05-04 NOTE — Telephone Encounter (Signed)
Will see her tomorrow

## 2017-05-04 NOTE — Telephone Encounter (Signed)
Pt has appt on 05/05/17 at 10 AM with Dr Milinda Antisower.

## 2017-05-04 NOTE — Telephone Encounter (Signed)
[facility] - Client TELEPHONE ADVICE RECORD [facility]  Patient Name: Ruth Gill  DOB: March 11, 1961    Initial Comment Caller states her right leg behind her knee is in pain, she did feel a knot and she needs to know if she needs it checked out    Nurse Assessment  Nurse: Odis LusterBowers, RN, Bjorn Loserhonda Date/Time (Eastern Time): 05/04/2017 12:03:30 PM  Confirm and document reason for call. If symptomatic, describe symptoms. ---Caller states her right leg behind her knee is in pain, she did feel a knot and she needs to know if she needs it checked out. Second day noted. Leg is aching.  Does the patient have any new or worsening symptoms? ---Yes  Will a triage be completed? ---Yes  Related visit to physician within the last 2 weeks? ---No  Does the PT have any chronic conditions? (i.e. diabetes, asthma, etc.) ---No  Is this a behavioral health or substance abuse call? ---No     Guidelines    Guideline Title Affirmed Question Affirmed Notes  Leg Pain Localized pain, redness or hard lump along vein    Final Disposition User   See Physician within 24 Hours Odis LusterBowers, RN, Bjorn Loserhonda    Comments  [facility] Dr. Milinda Antisower for 05/05/17. Caller voiced understanding.   Referrals  REFERRED TO PCP OFFICE   Disagree/Comply: Comply

## 2017-05-05 ENCOUNTER — Ambulatory Visit (INDEPENDENT_AMBULATORY_CARE_PROVIDER_SITE_OTHER): Payer: BC Managed Care – PPO | Admitting: Family Medicine

## 2017-05-05 ENCOUNTER — Encounter: Payer: Self-pay | Admitting: Family Medicine

## 2017-05-05 VITALS — BP 118/78 | HR 56 | Temp 98.3°F | Ht 62.5 in | Wt 121.5 lb

## 2017-05-05 DIAGNOSIS — R2241 Localized swelling, mass and lump, right lower limb: Secondary | ICD-10-CM

## 2017-05-05 DIAGNOSIS — M25561 Pain in right knee: Secondary | ICD-10-CM

## 2017-05-05 NOTE — Assessment & Plan Note (Signed)
Small mobile (1-1.5 cm) lump behind knee that is slt tender Not discolored  No s/s of dvt Suspect bakers cyst or phlebitis  Sent for US of LE  Adv ice/ibuprofen prn

## 2017-05-05 NOTE — Progress Notes (Signed)
Subjective:    Patient ID: Gardiner RhymeBelinda M Lyness, female    DOB: [birth year], 56 y.o.   MRN: [id number]  HPI Here for R knee pain / posterior with a knot   On and off for a year  The last week- lump behind knee (mobile) and a little tender  Worse with movement / feels bruised or sore  At one time it felt tight   No hx of blood clots or varicose veins  No swelling in the knee   Years ago-had a cortisone shot in her knee for pain - ? What her diagnosis  Took care of it at the time No arthritis that she knows of   No trauma that she knows  Took ibuprofen yesterday-helpful  No ice or heat   Patient Active Problem List   Diagnosis Date Noted  . Lump of thigh, right 05/05/2017  . Posterior right knee pain 05/05/2017  . Hypothyroid 04/13/2014  . Colon cancer screening 01/12/2014  . Routine general medical examination at a health care facility 01/04/2014  . ALLERGIC RHINITIS 12/22/2007   Past Medical History:  Diagnosis Date  . Allergic rhinitis, cause unspecified   . Anemia, unspecified   . Mastodynia    Past Surgical History:  Procedure Laterality Date  . ABDOMINAL HYSTERECTOMY     Social History  Substance Use Topics  . Smoking status: Never Smoker  . Smokeless tobacco: Never Used  . Alcohol use No   No family history on file. Allergies  Allergen Reactions  . Azithromycin     REACTION: joint swelling, stiffness  . Penicillins     REACTION: SOB, rash   Current Outpatient Prescriptions on File Prior to Visit  Medication Sig Dispense Refill  . Biotin 5000 MCG CAPS Take 1 capsule by mouth daily.    . Cholecalciferol (VITAMIN D) 2000 UNITS tablet Take 4,000 Units by mouth daily.     Marland Kitchen. estradiol (ESTRACE) 1 MG tablet Take 1 tablet by mouth daily.     . fexofenadine (ALLEGRA) 180 MG tablet Take 180 mg by mouth daily as needed.      . flintstones complete (FLINTSTONES) 60 MG chewable tablet Chew 1 tablet by mouth daily.    . fluticasone (FLONASE) 50 MCG/ACT nasal spray  Place 2 sprays into both nostrils daily as needed. 16 g 11  . levothyroxine (SYNTHROID, LEVOTHROID) 75 MCG tablet TAKE 1 TABLET (75 MCG TOTAL) BY MOUTH DAILY. 30 tablet 11  . Omega-3 Fatty Acids (FISH OIL) 1200 MG CAPS Take 1 capsule by mouth daily.    Marland Kitchen. VAGIFEM 10 MCG TABS vaginal tablet Place 1 tablet vaginally 2 (two) times a week. TAKES AS NEEDED  11   No current facility-administered medications on file prior to visit.     Review of Systems    Review of Systems  Constitutional: Negative for fever, appetite change, fatigue and unexpected weight change.  Eyes: Negative for pain and visual disturbance.  Respiratory: Negative for cough and shortness of breath.   Cardiovascular: Negative for cp or palpitations    Gastrointestinal: Negative for nausea, diarrhea and constipation.  Genitourinary: Negative for urgency and frequency.  Skin: Negative for pallor or rash   MSK pos for posterior knee pain R Neurological: Negative for weakness, light-headedness, numbness and headaches.  Hematological: Negative for adenopathy. Does not bruise/bleed easily.  Psychiatric/Behavioral: Negative for dysphoric mood. The patient is not nervous/anxious.      Objective:   Physical Exam  Constitutional: She appears well-developed and well-nourished. No  distress.  Well appearing   HENT:  Head: Normocephalic and atraumatic.  Eyes: Pupils are equal, round, and reactive to light. Conjunctivae and EOM are normal. No scleral icterus.  Neck: Normal range of motion. Neck supple.  Cardiovascular: Normal rate and regular rhythm.   Pulmonary/Chest: Effort normal and breath sounds normal. No respiratory distress. She has no wheezes. She has no rales.  Musculoskeletal:       Right knee: She exhibits normal range of motion, no swelling, no effusion, no deformity, no erythema, normal alignment, no LCL laxity, no bony tenderness, normal meniscus and no MCL laxity. No tenderness found.  1-1.5 round to oval rubbery  mobile lump felt post to R knee that is nt No redness or skin change  Otherwise nl knee exam  Lymphadenopathy:    She has no cervical adenopathy.  Neurological: She is alert.  Skin: Skin is warm and dry. No rash noted. No erythema.          Assessment & Plan:   Problem List Items Addressed This Visit      Other   Lump of thigh, right    Small mobile (1-1.5 cm) lump behind knee that is slt tender Not discolored  No s/s of dvt Suspect bakers cyst or phlebitis  Sent for [place] of LE  Adv ice/ibuprofen prn      Relevant Orders   VAS [place] LOWER EXTREMITY VENOUS (DVT)   Posterior right knee pain    With a small lump palpable  Suspect bakers cyst or varicose vein Ibuprofen/ice prn [place] of LE ordered       Relevant Orders   VAS [place] LOWER EXTREMITY VENOUS (DVT)

## 2017-05-05 NOTE — Patient Instructions (Signed)
Use a cold compress on back of knee if needed Ibuprofen is ok  Avoid straining knee /leg  Stop at check out for referral for ultrasound   We will make a plan from there

## 2017-05-05 NOTE — Assessment & Plan Note (Signed)
With a small lump palpable  Suspect bakers cyst or varicose vein Ibuprofen/ice prn US of LE ordered

## 2017-05-06 ENCOUNTER — Telehealth: Payer: Self-pay | Admitting: Family Medicine

## 2017-05-06 NOTE — Telephone Encounter (Signed)
Yes- not urgent, that is fine  Thanks

## 2017-05-06 NOTE — Telephone Encounter (Signed)
Dr Milinda Antisower I want to make sure pt VAS US Lower extremity venous is not urgent?  I made pt an appointment @ [facility] in [place] Monday 8/13  Is this ok?? Thanks Zella Ballobin

## 2017-05-10 ENCOUNTER — Ambulatory Visit: Payer: BC Managed Care – PPO

## 2017-05-10 ENCOUNTER — Encounter (HOSPITAL_COMMUNITY): Payer: BC Managed Care – PPO

## 2017-05-10 DIAGNOSIS — M25561 Pain in right knee: Secondary | ICD-10-CM

## 2017-05-10 DIAGNOSIS — R2241 Localized swelling, mass and lump, right lower limb: Secondary | ICD-10-CM

## 2017-05-12 ENCOUNTER — Telehealth: Payer: Self-pay | Admitting: Family Medicine

## 2017-05-12 DIAGNOSIS — M7121 Synovial cyst of popliteal space [Baker], right knee: Secondary | ICD-10-CM | POA: Insufficient documentation

## 2017-05-12 NOTE — Telephone Encounter (Signed)
-----   Message from Shon MilletShapale M [place], [place] sent at 05/12/2017  8:38 AM EDT ----- Pt notified of US results and Dr. Royden Purlower's comments. She does want to see an ortho doctor. She doesn't have a preference in place she said [place] or [place] is fine. Please put the referral in and I [facility] pt [facility]

## 2017-05-12 NOTE — Telephone Encounter (Signed)
Order done Will route to [facility] 

## 2017-06-03 ENCOUNTER — Ambulatory Visit (INDEPENDENT_AMBULATORY_CARE_PROVIDER_SITE_OTHER): Payer: Self-pay

## 2017-06-03 ENCOUNTER — Ambulatory Visit (INDEPENDENT_AMBULATORY_CARE_PROVIDER_SITE_OTHER): Payer: BC Managed Care – PPO | Admitting: Family

## 2017-06-03 DIAGNOSIS — G8929 Other chronic pain: Secondary | ICD-10-CM | POA: Diagnosis not present

## 2017-06-03 DIAGNOSIS — M7121 Synovial cyst of popliteal space [Baker], right knee: Secondary | ICD-10-CM

## 2017-06-03 DIAGNOSIS — M25561 Pain in right knee: Secondary | ICD-10-CM | POA: Diagnosis not present

## 2017-06-03 NOTE — Progress Notes (Signed)
   Office Visit Note   Patient: Ruth Gill           Date of Birth: 06/01/1961           MRN: [id number] Visit Date: 06/03/2017              Requested by: Tower, Audrie GallusMarne A, MD [address] [place], [place] [id number] PCP: Judy Pimpleower, Marne A, MD  No chief complaint on file.     HPI: The patient is a 56 year old woman seen today for evaluation of chronic knee pain on the right. She recently had a Baker's cyst which she states has resolved. No posterior posterior pain today however she does have some medial joint line tenderness and occasional shooting pain and popping with bending. No locking or catching. She was last seen in the office in 2008 similar symptoms to proceed with Depo-Medrol injection. Radiographs at that time were negative for degenerative changes.  Assessment & Plan: Visit Diagnoses:  1. Chronic pain of right knee   2. Baker's cyst of knee, right     Plan: follow-up in office as needed.   Ibu Or Aleve as needed. We'll hold him Depo-Medrol injection and she is nearly asymptomatic at this time.  Follow-Up Instructions: Return in about 4 weeks (around 07/01/2017).   Right Knee Exam   Tenderness  The patient is experiencing tenderness in the medial joint line.  Range of Motion  The patient has normal right knee ROM.  Tests  Varus: negative Valgus: negative  Other  Swelling: none  Comments:  No baker cyst today      Patient is alert, oriented, no adenopathy, well-dressed, normal affect, normal respiratory effort.   Imaging: Xr Knee 1-2 Views Right  Result Date: 06/03/2017 Radiographs of right knee with no acute finding. No degenerative changes.  No images are attached to the encounter.  Labs: No results found for: HGBA1C, ESRSEDRATE, CRP, LABURIC, REPTSTATUS, GRAMSTAIN, CULT, LABORGA  Orders:  Orders Placed This Encounter  Procedures  . XR Knee 1-2 Views Right   No orders of the defined types were placed in this encounter.    Procedures: No procedures performed  Clinical Data: No additional findings.  ROS:  All other systems negative, except as noted in the HPI. Review of Systems  Constitutional: Negative for chills and fever.  Musculoskeletal: Positive for arthralgias and joint swelling.    Objective: Vital Signs: There were no vitals taken for this visit.  Specialty Comments:  No specialty comments available.  PMFS History: Patient Active Problem List   Diagnosis Date Noted  . Baker's cyst of knee, right 05/12/2017  . Lump of thigh, right 05/05/2017  . Posterior right knee pain 05/05/2017  . Hypothyroid 04/13/2014  . Colon cancer screening 01/12/2014  . Routine general medical examination at a health care facility 01/04/2014  . ALLERGIC RHINITIS 12/22/2007   Past Medical History:  Diagnosis Date  . Allergic rhinitis, cause unspecified   . Anemia, unspecified   . Mastodynia     No family history on file.  Past Surgical History:  Procedure Laterality Date  . ABDOMINAL HYSTERECTOMY     Social History   Occupational History  . [employer]   Social History Main Topics  . Smoking status: Never Smoker  . Smokeless tobacco: Never Used  . Alcohol use No  . Drug use: No  . Sexual activity: Not on file

## 2017-07-21 ENCOUNTER — Other Ambulatory Visit: Payer: Self-pay | Admitting: *Deleted

## 2017-07-21 MED ORDER — LEVOTHYROXINE SODIUM 75 MCG PO TABS: ORAL_TABLET | ORAL | 0 refills | Status: DC

## 2017-07-25 ENCOUNTER — Telehealth: Payer: Self-pay | Admitting: Family Medicine

## 2017-07-25 DIAGNOSIS — E038 Other specified hypothyroidism: Secondary | ICD-10-CM

## 2017-07-25 DIAGNOSIS — Z0000 Encounter for general adult medical examination without abnormal findings: Secondary | ICD-10-CM

## 2017-07-25 NOTE — Telephone Encounter (Signed)
-----   Message from Alvina Chouerri J Walsh sent at 07/23/2017 11:44 AM EDT ----- Regarding: lab orders for Thursday, 11.8.18 Patient is scheduled for CPX labs, please order future labs, Thanks , Camelia Engerri

## 2017-08-05 ENCOUNTER — Other Ambulatory Visit: Payer: BC Managed Care – PPO

## 2017-08-09 ENCOUNTER — Encounter: Payer: BC Managed Care – PPO | Admitting: Family Medicine

## 2017-08-25 ENCOUNTER — Other Ambulatory Visit (INDEPENDENT_AMBULATORY_CARE_PROVIDER_SITE_OTHER): Payer: BC Managed Care – PPO

## 2017-08-25 ENCOUNTER — Other Ambulatory Visit: Payer: Self-pay | Admitting: *Deleted

## 2017-08-25 DIAGNOSIS — E038 Other specified hypothyroidism: Secondary | ICD-10-CM | POA: Diagnosis not present

## 2017-08-25 DIAGNOSIS — Z0000 Encounter for general adult medical examination without abnormal findings: Secondary | ICD-10-CM | POA: Diagnosis not present

## 2017-08-25 LAB — COMPREHENSIVE METABOLIC PANEL
ALBUMIN: 4.3 g/dL (ref 3.5–5.2)
ALT: 14 U/L (ref 0–35)
AST: 16 U/L (ref 0–37)
Alkaline Phosphatase: 43 U/L (ref 39–117)
BILIRUBIN TOTAL: 0.5 mg/dL (ref 0.2–1.2)
BUN: 24 mg/dL — AB (ref 6–23)
CHLORIDE: 102 meq/L (ref 96–112)
CO2: 33 mEq/L — ABNORMAL HIGH (ref 19–32)
Calcium: 9.3 mg/dL (ref 8.4–10.5)
Creatinine, Ser: 0.77 mg/dL (ref 0.40–1.20)
GFR: 81.83 mL/min (ref >60.00)
Glucose, Bld: 86 mg/dL (ref 70–99)
Potassium: 3.8 mEq/L (ref 3.5–5.1)
Sodium: 140 mEq/L (ref 135–145)
TOTAL PROTEIN: 7.3 g/dL (ref 6.0–8.3)

## 2017-08-25 LAB — CBC WITH DIFFERENTIAL/PLATELET
BASOS ABS: 0.1 10*3/uL (ref 0.0–0.1)
Basophils Relative: 1.2 % (ref 0.0–3.0)
Eosinophils Absolute: 0.2 10*3/uL (ref 0.0–0.7)
Eosinophils Relative: 3 % (ref 0.0–5.0)
HEMATOCRIT: 39.6 % (ref 36.0–46.0)
Hemoglobin: 13.3 g/dL (ref 12.0–15.0)
LYMPHS ABS: 2 10*3/uL (ref 0.7–4.0)
LYMPHS PCT: 36.7 % (ref 12.0–46.0)
MCHC: 33.6 g/dL (ref 30.0–36.0)
MCV: 93.3 fl (ref 78.0–100.0)
MONOS PCT: 7.8 % (ref 3.0–12.0)
Monocytes Absolute: 0.4 10*3/uL (ref 0.1–1.0)
NEUTROS PCT: 51.3 % (ref 43.0–77.0)
Neutro Abs: 2.8 10*3/uL (ref 1.4–7.7)
Platelets: 232 10*3/uL (ref 150.0–400.0)
RBC: 4.24 Mil/uL (ref 3.87–5.11)
RDW: 12.3 % (ref 11.5–15.5)
WBC: 5.5 10*3/uL (ref 4.0–10.5)

## 2017-08-25 LAB — TSH: TSH: 3.45 u[IU]/mL (ref 0.35–4.50)

## 2017-08-25 LAB — LIPID PANEL
CHOL/HDL RATIO: 4
Cholesterol: 187 mg/dL (ref 0–200)
HDL: 53.5 mg/dL (ref >39.00)
LDL Cholesterol: 119 mg/dL — ABNORMAL HIGH (ref 0–99)
NONHDL: 133.92
Triglycerides: 76 mg/dL (ref 0.0–149.0)
VLDL: 15.2 mg/dL (ref 0.0–40.0)

## 2017-08-25 MED ORDER — LEVOTHYROXINE SODIUM 75 MCG PO TABS: ORAL_TABLET | ORAL | 1 refills | Status: DC

## 2017-09-01 ENCOUNTER — Ambulatory Visit (INDEPENDENT_AMBULATORY_CARE_PROVIDER_SITE_OTHER): Payer: BC Managed Care – PPO | Admitting: Family Medicine

## 2017-09-01 ENCOUNTER — Encounter: Payer: Self-pay | Admitting: Family Medicine

## 2017-09-01 VITALS — BP 126/70 | HR 57 | Temp 97.9°F | Ht 62.5 in | Wt 119.2 lb

## 2017-09-01 DIAGNOSIS — Z23 Encounter for immunization: Secondary | ICD-10-CM

## 2017-09-01 DIAGNOSIS — E038 Other specified hypothyroidism: Secondary | ICD-10-CM

## 2017-09-01 DIAGNOSIS — Z1211 Encounter for screening for malignant neoplasm of colon: Secondary | ICD-10-CM

## 2017-09-01 DIAGNOSIS — Z0000 Encounter for general adult medical examination without abnormal findings: Secondary | ICD-10-CM

## 2017-09-01 MED ORDER — LEVOTHYROXINE SODIUM 75 MCG PO TABS: ORAL_TABLET | ORAL | 3 refills | Status: DC

## 2017-09-01 NOTE — Assessment & Plan Note (Signed)
Reviewed health habits including diet and exercise and skin cancer prevention Reviewed appropriate screening tests for age  Also reviewed health mt list, fam hx and immunization status , as well as social and family history   See HPI Labs reviewed  Good weight mt and habits Disc low cholesterol diet  She will inquire about Shingrix at the pharmacy Flu shot today  Will plan on colonoscopy in the spring for screening

## 2017-09-01 NOTE — Assessment & Plan Note (Signed)
Hypothyroidism  Pt has no clinical changes No change in energy level/ hair or skin/ edema and no tremor Lab Results  Component Value Date   TSH 3.45 08/25/2017

## 2017-09-01 NOTE — Patient Instructions (Addendum)
Call us in February to request a colonoscopy referral for spring   Flu shot today   If you are interested in the shingles vaccine (Shingrix)-  Check in with your pharmacy   Flu vaccine today

## 2017-09-01 NOTE — Progress Notes (Signed)
Subjective:    Patient ID: Ruth Gill, female    DOB: 09-25-1961, 56 y.o.   MRN: [id number]  HPI  Here for health maintenance exam and to review chronic medical problems    Keeping 2 grand children - under 1 yo  Is tired   Notices she bruises easier  Is on fish oil    Wt Readings from Last 3 Encounters:  09/01/17 119 lb 4 oz (54.1 kg)  05/05/17 121 lb 8 oz (55.1 kg)  08/04/16 136 lb (61.7 kg)  lost some weight - she likes being on the slim side- she lost 20 lb with wt watchers and put a bit back on /happy with weight now! 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  21.46 kg/m  Colon screen /ifob 11/16 Needs to arrange colonoscopy  She may want to do it around spring break   Flu shot -due today   Pap 11/15 Partial hysterectomy Still sees gyn- will on jan 10th / gets occasional pap   Mammogram 09/30/16 - gets at gyn/ 3 D  Self breast exam - no lumps   Tetanus shot 4/15  Zoster status -will ask about pharmacy shingrix   Hypothyroidism  Pt has no clinical changes No change in energy level/ hair or skin/ edema and no tremor Lab Results  Component Value Date   TSH 3.45 08/25/2017    She forgot her medicine the day of the draw   Cholesterol Lab Results  Component Value Date   CHOL 187 08/25/2017   CHOL 199 07/29/2016   CHOL 196 07/15/2015   Lab Results  Component Value Date   HDL 53.50 08/25/2017   HDL 65.20 07/29/2016   HDL 62.60 07/15/2015   Lab Results  Component Value Date   LDLCALC 119 (H) 08/25/2017   LDLCALC 118 (H) 07/29/2016   LDLCALC 116 (H) 07/15/2015   Lab Results  Component Value Date   TRIG 76.0 08/25/2017   TRIG 78.0 07/29/2016   TRIG 87.0 07/15/2015   Lab Results  Component Value Date   CHOLHDL 4 08/25/2017   CHOLHDL 3 07/29/2016   CHOLHDL 3 07/15/2015   No results found for: LDLDIRECT Good profile - diet is generally healthy   Other labs: Results for orders placed or performed in visit on 08/25/17  TSH  Result Value Ref Range   TSH  3.45 0.35 - 4.50 uIU/mL  Lipid panel  Result Value Ref Range   Cholesterol 187 0 - 200 mg/dL   Triglycerides 74.2 0.0 - 149.0 mg/dL   HDL 59.56 >38.75 mg/dL   VLDL 64.3 0.0 - 32.9 mg/dL   LDL Cholesterol 518 (H) 0 - 99 mg/dL   Total CHOL/HDL Ratio 4    NonHDL 133.92   CBC with Differential/Platelet  Result Value Ref Range   WBC 5.5 4.0 - 10.5 K/uL   RBC 4.24 3.87 - 5.11 Mil/uL   Hemoglobin 13.3 12.0 - 15.0 g/dL   HCT 84.1 66.0 - 63.0 %   MCV 93.3 78.0 - 100.0 fl   MCHC 33.6 30.0 - 36.0 g/dL   RDW 16.0 10.9 - 32.3 %   Platelets 232.0 150.0 - 400.0 K/uL   Neutrophils Relative % 51.3 43.0 - 77.0 %   Lymphocytes Relative 36.7 12.0 - 46.0 %   Monocytes Relative 7.8 3.0 - 12.0 %   Eosinophils Relative 3.0 0.0 - 5.0 %   Basophils Relative 1.2 0.0 - 3.0 %   Neutro Abs 2.8 1.4 - 7.7 K/uL   Lymphs Abs 2.0  0.7 - 4.0 K/uL   Monocytes Absolute 0.4 0.1 - 1.0 K/uL   Eosinophils Absolute 0.2 0.0 - 0.7 K/uL   Basophils Absolute 0.1 0.0 - 0.1 K/uL  Comprehensive metabolic panel  Result Value Ref Range   Sodium 140 135 - 145 mEq/L   Potassium 3.8 3.5 - 5.1 mEq/L   Chloride 102 96 - 112 mEq/L   CO2 33 (H) 19 - 32 mEq/L   Glucose, Bld 86 70 - 99 mg/dL   BUN 24 (H) 6 - 23 mg/dL   Creatinine, Ser 0.980.77 0.40 - 1.20 mg/dL   Total Bilirubin 0.5 0.2 - 1.2 mg/dL   Alkaline Phosphatase 43 39 - 117 U/L   AST 16 0 - 37 U/L   ALT 14 0 - 35 U/L   Total Protein 7.3 6.0 - 8.3 g/dL   Albumin 4.3 3.5 - 5.2 g/dL   Calcium 9.3 8.4 - 11.910.5 mg/dL   GFR 14.7881.83 >29.56>60.00 mL/min     Patient Active Problem List   Diagnosis Date Noted  . Baker's cyst of knee, right 05/12/2017  . Lump of thigh, right 05/05/2017  . Posterior right knee pain 05/05/2017  . Hypothyroid 04/13/2014  . Colon cancer screening 01/12/2014  . Routine general medical examination at a health care facility 01/04/2014  . ALLERGIC RHINITIS 12/22/2007   Past Medical History:  Diagnosis Date  . Allergic rhinitis, cause unspecified   .  Anemia, unspecified   . Mastodynia    Past Surgical History:  Procedure Laterality Date  . ABDOMINAL HYSTERECTOMY     Social History   Tobacco Use  . Smoking status: Never Smoker  . Smokeless tobacco: Never Used  Substance Use Topics  . Alcohol use: No    Alcohol/week: 0.0 oz  . Drug use: No   History reviewed. No pertinent family history. Allergies  Allergen Reactions  . Azithromycin     REACTION: joint swelling, stiffness  . Penicillins     REACTION: SOB, rash   Current Outpatient Medications on File Prior to Visit  Medication Sig Dispense Refill  . Biotin 5000 MCG CAPS Take 1 capsule by mouth daily.    . Cholecalciferol (VITAMIN D) 2000 UNITS tablet Take 4,000 Units by mouth daily.     Marland Kitchen. estradiol (ESTRACE) 1 MG tablet Take 1 tablet by mouth daily.     . fexofenadine (ALLEGRA) 180 MG tablet Take 180 mg by mouth daily as needed.      . flintstones complete (FLINTSTONES) 60 MG chewable tablet Chew 1 tablet by mouth daily.    . fluticasone (FLONASE) 50 MCG/ACT nasal spray Place 2 sprays into both nostrils daily as needed. 16 g 11  . Omega-3 Fatty Acids (FISH OIL) 1200 MG CAPS Take 1 capsule by mouth daily.    Marland Kitchen. VAGIFEM 10 MCG TABS vaginal tablet Place 1 tablet vaginally 2 (two) times a week. TAKES AS NEEDED  11   No current facility-administered medications on file prior to visit.      Review of Systems  Constitutional: Negative for activity change, appetite change, fatigue, fever and unexpected weight change.  HENT: Negative for congestion, rhinorrhea, sore throat and trouble swallowing.   Eyes: Negative for pain, redness, itching and visual disturbance.  Respiratory: Negative for cough, chest tightness, shortness of breath and wheezing.   Cardiovascular: Negative for chest pain and palpitations.  Gastrointestinal: Negative for abdominal pain, blood in stool, constipation, diarrhea and nausea.  Endocrine: Negative for cold intolerance, heat intolerance, polydipsia and  polyuria.  Genitourinary: Negative for difficulty urinating, dysuria, frequency and urgency.  Musculoskeletal: Negative for arthralgias, joint swelling and myalgias.  Skin: Negative for pallor and rash.  Neurological: Negative for dizziness, tremors, weakness, numbness and headaches.  Hematological: Negative for adenopathy. Does not bruise/bleed easily.  Psychiatric/Behavioral: Negative for decreased concentration and dysphoric mood. The patient is not nervous/anxious.        Objective:   Physical Exam  Constitutional: She appears well-developed and well-nourished. No distress.  Slim and well appearing   HENT:  Head: Normocephalic and atraumatic.  Right Ear: External ear normal.  Left Ear: External ear normal.  Nose: Nose normal.  Mouth/Throat: Oropharynx is clear and moist.  Eyes: Conjunctivae and EOM are normal. Pupils are equal, round, and reactive to light. Right eye exhibits no discharge. Left eye exhibits no discharge. No scleral icterus.  Neck: Normal range of motion. Neck supple. No JVD present. Carotid bruit is not present. No thyromegaly present.  Cardiovascular: Normal rate, regular rhythm, normal heart sounds and intact distal pulses. Exam reveals no gallop.  Pulmonary/Chest: Effort normal and breath sounds normal. No respiratory distress. She has no wheezes. She has no rales.  Abdominal: Soft. Bowel sounds are normal. She exhibits no distension and no mass. There is no tenderness.  Genitourinary:  Genitourinary Comments: Goes to gyn for exam   Musculoskeletal: She exhibits no edema or tenderness.  Lymphadenopathy:    She has no cervical adenopathy.  Neurological: She is alert. She has normal reflexes. No cranial nerve deficit. She exhibits normal muscle tone. Coordination normal.  Skin: Skin is warm and dry. No rash noted. No erythema. No pallor.  Solar lentigines diffusely Several brown flat nevi on back are stable  Psychiatric: She has a normal mood and affect.           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) Will call us in feb for referral  Can do ifob if she changes her mind      Routine general medical examination at a health care facility    Reviewed health habits including diet and exercise and skin cancer prevention Reviewed appropriate screening tests for age  Also reviewed health mt list, fam hx and immunization status , as well as social and family history   See HPI Labs reviewed  Good weight mt and habits Disc low cholesterol diet  She will inquire about Shingrix at the pharmacy Flu shot today  Will plan on colonoscopy in the spring for screening        Other Visit Diagnoses    Need for influenza vaccination       Relevant Orders   Flu Vaccine QUAD 6+ mos PF IM (Fluarix Quad PF) (Completed)

## 2017-09-01 NOTE — Assessment & Plan Note (Signed)
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

## 2017-11-30 ENCOUNTER — Ambulatory Visit: Payer: BC Managed Care – PPO | Admitting: Family Medicine

## 2017-11-30 ENCOUNTER — Encounter: Payer: Self-pay | Admitting: Family Medicine

## 2017-11-30 ENCOUNTER — Ambulatory Visit (INDEPENDENT_AMBULATORY_CARE_PROVIDER_SITE_OTHER): Payer: BC Managed Care – PPO

## 2017-11-30 VITALS — BP 116/64 | HR 67 | Temp 97.9°F | Ht 62.5 in | Wt 121.2 lb

## 2017-11-30 DIAGNOSIS — M25521 Pain in right elbow: Secondary | ICD-10-CM

## 2017-11-30 MED ORDER — DICLOFENAC SODIUM 75 MG PO TBEC: 75.0000 mg | DELAYED_RELEASE_TABLET | Freq: Two times a day (BID) | ORAL | 0 refills | Status: DC

## 2017-11-30 NOTE — Patient Instructions (Signed)
Start the diclofenac.  Work on the exercises.  Let me know if your symptoms worsen or are not improving.  Take care, Dr Jimmey RalphParker

## 2017-11-30 NOTE — Progress Notes (Signed)
    Subjective:  Ruth Gill is a 57 y.o. female who presents today for same-day appointment with a chief complaint of right elbow pain.   HPI:  Right Elbow Pain, Acute Issue Started 2 months ago.  Symptoms started after patient bumped her elbow against the door while doing laundry.  It hurt for a few days and patient thought it would gradually get better.  Pain has worsened over the last couple of months.  She did not have any bruising to the area.  She has not tried any treatments.  Pain is described as a sharp intermittent pain that is worse with movement.  She wants to make sure that there is no bony damage as she wants to get back into weight training.  She has not noticed any other deviating or aggravating factors.  ROS: Per HPI  PMH: She reports that  has never smoked. she has never used smokeless tobacco. She reports that she does not drink alcohol or use drugs.  Objective:  Physical Exam: BP 116/64 (BP Location: Left Arm, Patient Position: Sitting, Cuff Size: Normal)   Pulse 67   Temp 97.9 F (36.6 C) (Oral)   Ht 5' 2.5" (1.588 m)   Wt 121 lb 3.2 oz (55 kg)   SpO2 98%   BMI 21.81 kg/m   Gen: NAD, resting comfortably MSK: -Right elbow: No deformities.  Tender palpation over medial epicondyles.  Pain elicited with resisted wrist flexion and resisted pronation.  Tinel signs negative.  Assessment/Plan:  Medial epicondylitis Plain film negative based on my read-we will await radiology read.  We will treat her for medial epicondylitis.  Start diclofenac 75 mg twice daily for the next 2 weeks.  Also recommended ice as needed.  Also recommended compression.  Discussed home exercise program.  Return precautions reviewed.  Consider referral to sports medicine if not improving with above measures.  Katina Degreealeb M. Jimmey RalphParker, MD 11/30/2017 3:48 PM

## 2018-07-19 ENCOUNTER — Encounter (HOSPITAL_COMMUNITY): Payer: Self-pay

## 2018-07-19 ENCOUNTER — Ambulatory Visit: Payer: Self-pay | Admitting: *Deleted

## 2018-07-19 ENCOUNTER — Ambulatory Visit: Payer: BC Managed Care – PPO | Admitting: Family Medicine

## 2018-07-19 ENCOUNTER — Other Ambulatory Visit: Payer: Self-pay

## 2018-07-19 ENCOUNTER — Emergency Department (HOSPITAL_COMMUNITY)
Admission: EM | Admit: 2018-07-19 | Discharge: 2018-07-19 | Disposition: A | Discharge status: Home / Self Care | Payer: BC Managed Care – PPO | Attending: Emergency Medicine | Admitting: Emergency Medicine

## 2018-07-19 DIAGNOSIS — R42 Dizziness and giddiness: Secondary | ICD-10-CM | POA: Diagnosis present

## 2018-07-19 DIAGNOSIS — Z79899 Other long term (current) drug therapy: Secondary | ICD-10-CM | POA: Insufficient documentation

## 2018-07-19 DIAGNOSIS — E039 Hypothyroidism, unspecified: Secondary | ICD-10-CM | POA: Diagnosis not present

## 2018-07-19 LAB — BASIC METABOLIC PANEL
Anion gap: 6 (ref 5–15)
BUN: 14 mg/dL (ref 6–20)
CALCIUM: 9.4 mg/dL (ref 8.9–10.3)
CHLORIDE: 103 mmol/L (ref 98–111)
CO2: 28 mmol/L (ref 22–32)
Creatinine, Ser: 0.66 mg/dL (ref 0.44–1.00)
GFR calc non Af Amer: >60 mL/min (ref >60)
Glucose, Bld: 121 mg/dL — ABNORMAL HIGH (ref 70–99)
Potassium: 3.8 mmol/L (ref 3.5–5.1)
Sodium: 137 mmol/L (ref 135–145)

## 2018-07-19 LAB — CBC
HCT: 37.7 % (ref 36.0–46.0)
Hemoglobin: 12.4 g/dL (ref 12.0–15.0)
MCH: 30.8 pg (ref 26.0–34.0)
MCHC: 32.9 g/dL (ref 30.0–36.0)
MCV: 93.8 fL (ref 80.0–100.0)
NRBC: 0 % (ref 0.0–0.2)
PLATELETS: 177 10*3/uL (ref 150–400)
RBC: 4.02 MIL/uL (ref 3.87–5.11)
RDW: 11.5 % (ref 11.5–15.5)
WBC: 9.2 10*3/uL (ref 4.0–10.5)

## 2018-07-19 LAB — URINALYSIS, ROUTINE W REFLEX MICROSCOPIC
Bilirubin Urine: NEGATIVE
GLUCOSE, UA: NEGATIVE mg/dL
Hgb urine dipstick: NEGATIVE
Ketones, ur: NEGATIVE mg/dL
Leukocytes, UA: NEGATIVE
Nitrite: NEGATIVE
PROTEIN: NEGATIVE mg/dL
SPECIFIC GRAVITY, URINE: 1.002 — AB (ref 1.005–1.030)
pH: 8 (ref 5.0–8.0)

## 2018-07-19 MED ORDER — MECLIZINE HCL 12.5 MG PO TABS: 12.5000 mg | ORAL_TABLET | Freq: Three times a day (TID) | ORAL | 0 refills | Status: AC | PRN

## 2018-07-19 MED ORDER — DIAZEPAM 5 MG PO TABS
5.0000 mg | ORAL_TABLET | Freq: Once | ORAL | Status: AC
  Administered 2018-07-19: 5 mg via ORAL
  Filled 2018-07-19: qty 1

## 2018-07-19 MED ORDER — MECLIZINE HCL 25 MG PO TABS
12.5000 mg | ORAL_TABLET | Freq: Once | ORAL | Status: AC
  Administered 2018-07-19: 12.5 mg via ORAL
  Filled 2018-07-19: qty 1

## 2018-07-19 NOTE — ED Notes (Signed)
Pt again oob to batghroom with steady gait.

## 2018-07-19 NOTE — Discharge Instructions (Addendum)
It was our pleasure to provide your ER care today - we hope that you feel better.  Rest. Drink plenty of fluids.   Given upper respiratory symptoms/ear fluid-congestion, take antihistamine/decongestant such as Zyrtec-D (available over the counter), for symptom relief.  Also try taking antivert as need for vertigo - may make drowsy, no driving when taking, or when feeling dizzy.   Follow up with primary care doctor in the next few days for recheck if symptoms fail to resolve.  Return to ER if worse, new symptoms, persistent vomiting, numbness/weakness, change in speech or vision, unable to walk, other concern.

## 2018-07-19 NOTE — Telephone Encounter (Signed)
In ED now I will follow notes

## 2018-07-19 NOTE — ED Provider Notes (Signed)
[facility] EMERGENCY DEPARTMENT Provider Note   CSN: [id number] Arrival date & time: 07/19/18  1228     History   Chief Complaint Chief Complaint  Patient presents with  . Dizziness    HPI Ruth Gill is a 57 y.o. female.  Patient presents w acute onset dizziness today, which she describes as room spinning sensation that is worth with head movements and other positional changes. No lightheadedness or syncope. No associate cp or discomfort. No sob or unusual doe. No palpitations. Denies any numbness/weakness or any change in speech or vision. States for past couple weeks has uri symptoms, with nasal congestion/rhinorrhea, ear congestion/fullness/popping. No hearing loss or tinnitus. Denies hx vertigo. No falls or coordination issues/symptoms.   The history is provided by the patient.  Dizziness  Associated symptoms: nausea   Associated symptoms: no chest pain, no headaches, no palpitations, no shortness of breath and no weakness     Past Medical History:  Diagnosis Date  . Allergic rhinitis, cause unspecified   . Anemia, unspecified   . Mastodynia     Patient Active Problem List   Diagnosis Date Noted  . Baker's cyst of knee, right 05/12/2017  . Lump of thigh, right 05/05/2017  . Hypothyroid 04/13/2014  . Colon cancer screening 01/12/2014  . Routine general medical examination at a health care facility 01/04/2014  . ALLERGIC RHINITIS 12/22/2007    Past Surgical History:  Procedure Laterality Date  . ABDOMINAL HYSTERECTOMY       OB History   None      Home Medications    Prior to Admission medications   Medication Sig Start Date End Date Taking? Authorizing Provider  Biotin 5000 MCG CAPS Take 1 capsule by mouth daily.    [provider]  Cholecalciferol (VITAMIN D) 2000 UNITS tablet Take 4,000 Units by mouth daily.     [provider]  diclofenac (VOLTAREN) 75 MG EC tablet Take 1 tablet (75 mg total) by mouth 2  (two) times daily. 11/30/17   Ardith Dark, MD  estradiol (ESTRACE) 1 MG tablet Take 1 tablet by mouth daily.  08/09/11   [provider]  fexofenadine (ALLEGRA) 180 MG tablet Take 180 mg by mouth daily as needed.      [provider]  flintstones complete (FLINTSTONES) 60 MG chewable tablet Chew 1 tablet by mouth daily.    [provider]  fluticasone (FLONASE) 50 MCG/ACT nasal spray Place 2 sprays into both nostrils daily as needed. 01/15/15   Tower, Audrie Gallus, MD  levothyroxine (SYNTHROID, LEVOTHROID) 75 MCG tablet TAKE 1 TABLET (75 MCG TOTAL) BY MOUTH DAILY. 09/01/17   Tower, Audrie Gallus, MD  Omega-3 Fatty Acids (FISH OIL) 1200 MG CAPS Take 1 capsule by mouth daily.    [provider]  VAGIFEM 10 MCG TABS vaginal tablet Place 1 tablet vaginally 2 (two) times a week. TAKES AS NEEDED 12/05/14   [provider]    Family History History reviewed. No pertinent family history.  Social History Social History   Tobacco Use  . Smoking status: Never Smoker  . Smokeless tobacco: Never Used  Substance Use Topics  . Alcohol use: No    Alcohol/week: 0.0 standard drinks  . Drug use: No     Allergies   Azithromycin and Penicillins   Review of Systems Review of Systems  Constitutional: Negative for fever.  HENT: Positive for congestion and rhinorrhea.   Eyes: Negative for visual disturbance.  Respiratory: Negative for  shortness of breath.   Cardiovascular: Negative for chest pain and palpitations.  Gastrointestinal: Positive for nausea. Negative for abdominal pain.  Genitourinary: Negative for flank pain.  Musculoskeletal: Negative for back pain and neck pain.  Skin: Negative for rash.  Neurological: Positive for dizziness. Negative for speech difficulty, weakness, numbness and headaches.  Hematological: Does not bruise/bleed easily.  Psychiatric/Behavioral: Negative for confusion.     Physical Exam Updated Vital Signs BP (!) 159/87   Pulse 85    Temp 98.1 F (36.7 C) (Oral)   Resp 16   Ht 1.6 m (5\' 3" )   Wt 54.4 kg   SpO2 99%   BMI 21.26 kg/m   Physical Exam  Constitutional: She is oriented to person, place, and time. She appears well-developed and well-nourished.  HENT:  Head: Atraumatic.  Mouth/Throat: Oropharynx is clear and moist.  Eyes: Pupils are equal, round, and reactive to light. Conjunctivae and EOM are normal. No scleral icterus.  Neck: Normal range of motion. Neck supple. No tracheal deviation present.  No bruits.   Cardiovascular: Normal rate, regular rhythm, normal heart sounds and intact distal pulses. Exam reveals no gallop and no friction rub.  No murmur heard. Pulmonary/Chest: Effort normal and breath sounds normal. No respiratory distress.  Abdominal: Soft. Normal appearance and bowel sounds are normal. She exhibits no distension. There is no tenderness.  Genitourinary:  Genitourinary Comments: No cva tenderness.   Musculoskeletal: She exhibits no edema.  Neurological: She is alert and oriented to person, place, and time.  Speech clear/fluent. No pronator drift. Motor intact bil, stre 5/5. sens grossly intact. Ambulates w steady gait. Transient unidirectional nystagmus.   Skin: Skin is warm and dry. No rash noted.  Psychiatric: She has a normal mood and affect.  Nursing note and vitals reviewed.    ED Treatments / Results  Labs (all labs ordered are listed, but only abnormal results are displayed) Results for orders placed or performed during the hospital encounter of 07/19/18  Basic metabolic panel  Result Value Ref Range   Sodium 137 135 - 145 mmol/L   Potassium 3.8 3.5 - 5.1 mmol/L   Chloride 103 98 - 111 mmol/L   CO2 28 22 - 32 mmol/L   Glucose, Bld 121 (H) 70 - 99 mg/dL   BUN 14 6 - 20 mg/dL   Creatinine, Ser 1.61 0.44 - 1.00 mg/dL   Calcium 9.4 8.9 - 09.6 mg/dL   GFR calc non Af Amer >60 >60 mL/min   GFR calc Af Amer >60 >60 mL/min   Anion gap 6 5 - 15  CBC  Result Value Ref Range    WBC 9.2 4.0 - 10.5 K/uL   RBC 4.02 3.87 - 5.11 MIL/uL   Hemoglobin 12.4 12.0 - 15.0 g/dL   HCT 04.5 40.9 - 81.1 %   MCV 93.8 80.0 - 100.0 fL   MCH 30.8 26.0 - 34.0 pg   MCHC 32.9 30.0 - 36.0 g/dL   RDW 91.4 78.2 - 95.6 %   Platelets 177 150 - 400 K/uL   nRBC 0.0 0.0 - 0.2 %  Urinalysis, Routine w reflex microscopic  Result Value Ref Range   Color, Urine COLORLESS (A) YELLOW   APPearance CLEAR CLEAR   Specific Gravity, Urine 1.002 (L) 1.005 - 1.030   pH 8.0 5.0 - 8.0   Glucose, UA NEGATIVE NEGATIVE mg/dL   Hgb urine dipstick NEGATIVE NEGATIVE   Bilirubin Urine NEGATIVE NEGATIVE   Ketones, ur NEGATIVE NEGATIVE mg/dL   Protein,  ur NEGATIVE NEGATIVE mg/dL   Nitrite NEGATIVE NEGATIVE   Leukocytes, UA NEGATIVE NEGATIVE   EKG EKG Interpretation  Date/Time:  Tuesday July 19 2018 12:40:43 EDT Ventricular Rate:  59 PR Interval:  160 QRS Duration: 92 QT Interval:  394 QTC Calculation: 390 R Axis:   58 Text Interpretation:  Sinus bradycardia Left axis deviation Incomplete right bundle branch block Nonspecific ST abnormality No significant change since last tracing Confirmed by Cathren Laine ([id number]) on 07/19/2018 3:01:41 PM   Radiology No results found.  Procedures Procedures (including critical care time)  Medications Ordered in ED Medications  diazepam (VALIUM) tablet 5 mg (has no administration in time range)  meclizine (ANTIVERT) tablet 12.5 mg (has no administration in time range)     Initial Impression / Assessment and Plan / ED Course  I have reviewed the triage vital signs and the nursing notes.  Pertinent labs & imaging results that were available during my care of the patient were reviewed by me and considered in my medical decision making (see chart for details).  Labs sent.   Reviewed nursing notes and prior charts for additional history.   Valium po, and antivert po for symptom relief.   Trial of po fluids.   Labs reviewed - chem normal.   Pt  ambulated to bathroom without ataxia or impaired coordination.   Recheck - feels much improved from prior. No recurrent nv.   Pt currently appears stable for d/c.      Final Clinical Impressions(s) / ED Diagnoses   Final diagnoses:  None    ED Discharge Orders    None       Cathren Laine, MD 07/19/18 1722

## 2018-07-19 NOTE — ED Notes (Signed)
Pt given po fluids and tolerating well. 

## 2018-07-19 NOTE — ED Triage Notes (Signed)
Pt presents with onset of dizziness that began this morning.  Pt reports as long as she looks forward and down the symptoms are gone, but she describes as the room spinning.  +nausea and vomiting; pt denies any headache, blurred vision.  Pt does reports URI x 3 weeks.

## 2018-07-19 NOTE — ED Notes (Signed)
Instructions to husband and patient. Both state they understand and agree.

## 2018-07-19 NOTE — Telephone Encounter (Signed)
Pt's daughter calling, pt present during call. [place] spoke with patient. Reports vertigo this am, "Room spinning." One episode of nausea and vomiting. States vertigo worsens with lying down, any movement of head, looking up. Denies any headache, one sided weakness. Speech clear. Reports "Some sinus issues" x 3 weeks; runny nose, ears clogged, left sided facial tenderness. States cold symptoms "Come and go over 3 weeks, family passing around."  Pt states she is presently sitting on floor in BR,  "But feeling better, just lightheaded now."  Appt made for today with Dr. Milinda Antis, however instructed daughter to assist pt up off floor while on triage call. Pt requiring max assist to ambulate; had to sit back down on floor. Pt directed to ED; offered to call EMS. Pt's daughter states her sister is on the way and they will get her to ED or call 911 if necessary.  Pt and daughter made aware appt with Dr. Milinda Antis was cancelled by [place] and reiterated need for eval in ED. Reason for Disposition . Vomiting occurs with dizziness . SEVERE dizziness (vertigo) (e.g., unable to walk without assistance)  Answer Assessment - Initial Assessment Questions 1. DESCRIPTION: "Describe your dizziness."     Lightheadedness, spinning 2. VERTIGO: "Do you feel like either you or the room is spinning or tilting?"      yes 3. LIGHTHEADED: "Do you feel lightheaded?" (e.g., somewhat faint, woozy, weak upon standing)     yes 4. SEVERITY: "How bad is it?"  "Can you walk?"   - MILD - Feels unsteady but walking normally.   - MODERATE - Feels very unsteady when walking, but not falling; interferes with normal activities (e.g., school, work) .   - SEVERE - Unable to walk without falling (requires assistance).     Severe initially, "Little better now." 5. ONSET:  "When did the dizziness begin?"     This am 6. AGGRAVATING FACTORS: "Does anything make it worse?" (e.g., standing, change in head position)     Change in head position, lying down 7.  CAUSE: "What do you think is causing the dizziness?"     Sinus issues x 3 weeks, cold , "some" sinus tenderness left facial area. 8. RECURRENT SYMPTOM: "Have you had dizziness before?" If so, ask: "When was the last time?" "What happened that time?"     no 9. OTHER SYMPTOMS: "Do you have any other symptoms?" (e.g., headache, weakness, numbness, vomiting, earache)   Ears stopped up, left facial area tender "Off and on x 3 weeks."  Protocols used: DIZZINESS - VERTIGO-A-AH

## 2018-07-22 ENCOUNTER — Encounter: Payer: Self-pay | Admitting: Family Medicine

## 2018-07-22 ENCOUNTER — Ambulatory Visit: Payer: BC Managed Care – PPO | Admitting: Family Medicine

## 2018-07-22 VITALS — BP 120/84 | HR 67 | Temp 98.2°F | Ht 62.5 in | Wt 120.8 lb

## 2018-07-22 DIAGNOSIS — J0100 Acute maxillary sinusitis, unspecified: Secondary | ICD-10-CM | POA: Diagnosis not present

## 2018-07-22 MED ORDER — DOXYCYCLINE HYCLATE 100 MG PO TABS: 100.0000 mg | ORAL_TABLET | Freq: Two times a day (BID) | ORAL | 0 refills | Status: DC

## 2018-07-22 NOTE — Progress Notes (Signed)
BP 120/84 (BP Location: Left Arm, Patient Position: Sitting, Cuff Size: Normal)   Pulse 67   Temp 98.2 F (36.8 C) (Oral)   Ht 5' 2.5" (1.588 m)   Wt 120 lb 12 oz (54.8 kg)   SpO2 99%   BMI 21.73 kg/m    CC: nasal congestion Subjective:    Patient ID: Ruth Gill, female    DOB: September 21, 1961, 57 y.o.   MRN: [id number]  HPI: OLIVER NEUWIRTH is a 57 y.o. female presenting on 07/22/2018 for Nasal Congestion (C/o nasal congestion, drainage, sinus pressure and ears popping. )   3 wk h/o nasal congestion, sneezing, rhinorrhea, mild cough. Symptoms worsened 3d ago associated with vertigo. Seen at ER and told vertigo from inner ear disorder - note reviewed, treated with meclizine and diazepam. Yesterday felt soreness at L maxillary sinus. Ears popping. Some L jaw pain.   Taking mucinex D, ibuprofen, drinking fluids, airborne TID, flonase, allegra. Also tried essential oils.  No fevers/chills, ST or PNdrainage, dyspnea or wheezing.  + sick contacts at home (cares for 2 grandchildren).  No h/o asthma.  No smokers at home.  Relevant past medical, surgical, family and social history reviewed and updated as indicated. Interim medical history since our last visit reviewed. Allergies and medications reviewed and updated. Outpatient Medications Prior to Visit  Medication Sig Dispense Refill  . Biotin 5000 MCG CAPS Take 1 capsule by mouth daily.    . Cholecalciferol (VITAMIN D) 2000 UNITS tablet Take 4,000 Units by mouth daily.     Marland Kitchen estradiol (ESTRACE) 1 MG tablet Take 1 tablet by mouth daily.     . fexofenadine (ALLEGRA) 180 MG tablet Take 180 mg by mouth daily as needed.      . flintstones complete (FLINTSTONES) 60 MG chewable tablet Chew 1 tablet by mouth daily.    . fluticasone (FLONASE) 50 MCG/ACT nasal spray Place 2 sprays into both nostrils daily as needed. 16 g 11  . levothyroxine (SYNTHROID, LEVOTHROID) 75 MCG tablet TAKE 1 TABLET (75 MCG TOTAL) BY MOUTH DAILY. 90 tablet 3  .  meclizine (ANTIVERT) 12.5 MG tablet Take 1 tablet (12.5 mg total) by mouth 3 (three) times daily as needed for dizziness. 15 tablet 0  . Omega-3 Fatty Acids (FISH OIL) 1200 MG CAPS Take 1 capsule by mouth daily.    Marland Kitchen VAGIFEM 10 MCG TABS vaginal tablet Place 1 tablet vaginally 2 (two) times a week. TAKES AS NEEDED  11  . diclofenac (VOLTAREN) 75 MG EC tablet Take 1 tablet (75 mg total) by mouth 2 (two) times daily. (Patient not taking: Reported on 07/19/2018) 30 tablet 0   No facility-administered medications prior to visit.      Per HPI unless specifically indicated in ROS section below Review of Systems     Objective:    BP 120/84 (BP Location: Left Arm, Patient Position: Sitting, Cuff Size: Normal)   Pulse 67   Temp 98.2 F (36.8 C) (Oral)   Ht 5' 2.5" (1.588 m)   Wt 120 lb 12 oz (54.8 kg)   SpO2 99%   BMI 21.73 kg/m   Wt Readings from Last 3 Encounters:  07/22/18 120 lb 12 oz (54.8 kg)  07/19/18 120 lb (54.4 kg)  11/30/17 121 lb 3.2 oz (55 kg)    Physical Exam  Constitutional: She appears well-developed and well-nourished. No distress.  HENT:  Head: Normocephalic and atraumatic.  Right Ear: Hearing, tympanic membrane, external ear and ear canal normal.  Left  Ear: Hearing, tympanic membrane, external ear and ear canal normal.  Nose: Mucosal edema (R<L) present. No rhinorrhea. Right sinus exhibits no maxillary sinus tenderness and no frontal sinus tenderness. Left sinus exhibits maxillary sinus tenderness. Left sinus exhibits no frontal sinus tenderness.  Mouth/Throat: Uvula is midline, oropharynx is clear and moist and mucous membranes are normal. No oropharyngeal exudate, posterior oropharyngeal edema, posterior oropharyngeal erythema or tonsillar abscesses.  Evident thick mucous present L posterior nasal passage  Eyes: Pupils are equal, round, and reactive to light. Conjunctivae and EOM are normal. No scleral icterus.  Neck: Normal range of motion. Neck supple.    Cardiovascular: Normal rate, regular rhythm, normal heart sounds and intact distal pulses.  No murmur heard. Pulmonary/Chest: Effort normal and breath sounds normal. No respiratory distress. She has no wheezes. She has no rales.  Lymphadenopathy:    She has no cervical adenopathy.  Skin: Skin is warm and dry. No rash noted.  Nursing note and vitals reviewed.     Assessment & Plan:   Problem List Items Addressed This Visit    Acute sinusitis - Primary    Anticipate bacterial given duration and progression of symptoms - treat with doxycycline course 10 days - discussed photosensitivity precautions, advised to take with food. Update if not improving with treatment.       Relevant Medications   doxycycline (VIBRA-TABS) 100 MG tablet       Meds ordered this encounter  Medications  . DISCONTD: doxycycline (VIBRA-TABS) 100 MG tablet    Sig: Take 1 tablet (100 mg total) by mouth 2 (two) times daily.    Dispense:  14 tablet    Refill:  0  . doxycycline (VIBRA-TABS) 100 MG tablet    Sig: Take 1 tablet (100 mg total) by mouth 2 (two) times daily.    Dispense:  20 tablet    Refill:  0    Use 10 day course   No orders of the defined types were placed in this encounter.   Follow up plan: Return if symptoms worsen or fail to improve.  Eustaquio Boyden, MD

## 2018-07-22 NOTE — Assessment & Plan Note (Addendum)
Anticipate bacterial given duration and progression of symptoms - treat with doxycycline course 10 days - discussed photosensitivity precautions, advised to take with food. Update if not improving with treatment.

## 2018-07-22 NOTE — Patient Instructions (Addendum)
You have a sinus infection. Take medicine as prescribed: doxycycline 10 days  Push fluids and plenty of rest.  Nasal saline irrigation or neti pot to help drain sinuses. May use mucinex D with plenty of fluid to help mobilize mucous. Please let us know if fever >101.5, trouble opening/closing mouth, difficulty swallowing, or worsening instead of improving as expected.

## 2018-08-30 ENCOUNTER — Telehealth: Payer: Self-pay | Admitting: Family Medicine

## 2018-08-30 DIAGNOSIS — E039 Hypothyroidism, unspecified: Secondary | ICD-10-CM

## 2018-08-30 DIAGNOSIS — Z0000 Encounter for general adult medical examination without abnormal findings: Secondary | ICD-10-CM

## 2018-08-30 NOTE — Telephone Encounter (Signed)
-----   Message from Wendi MayaLauren Greeson, RT sent at 08/22/2018 12:28 PM EST ----- Regarding: Lab orders for Wednesday 08/31/18 Please enter CPE lab orders for 08/31/18. Thanks!

## 2018-08-31 ENCOUNTER — Other Ambulatory Visit (INDEPENDENT_AMBULATORY_CARE_PROVIDER_SITE_OTHER): Payer: BC Managed Care – PPO

## 2018-08-31 DIAGNOSIS — E039 Hypothyroidism, unspecified: Secondary | ICD-10-CM | POA: Diagnosis not present

## 2018-08-31 DIAGNOSIS — Z0000 Encounter for general adult medical examination without abnormal findings: Secondary | ICD-10-CM | POA: Diagnosis not present

## 2018-08-31 LAB — CBC WITH DIFFERENTIAL/PLATELET
BASOS ABS: 0 10*3/uL (ref 0.0–0.1)
BASOS PCT: 1.1 % (ref 0.0–3.0)
EOS PCT: 4.3 % (ref 0.0–5.0)
Eosinophils Absolute: 0.2 10*3/uL (ref 0.0–0.7)
HEMATOCRIT: 38.3 % (ref 36.0–46.0)
Hemoglobin: 13 g/dL (ref 12.0–15.0)
LYMPHS ABS: 1.9 10*3/uL (ref 0.7–4.0)
LYMPHS PCT: 45.9 % (ref 12.0–46.0)
MCHC: 33.9 g/dL (ref 30.0–36.0)
MCV: 92.6 fl (ref 78.0–100.0)
MONOS PCT: 7.3 % (ref 3.0–12.0)
Monocytes Absolute: 0.3 10*3/uL (ref 0.1–1.0)
NEUTROS PCT: 41.4 % — AB (ref 43.0–77.0)
Neutro Abs: 1.7 10*3/uL (ref 1.4–7.7)
PLATELETS: 184 10*3/uL (ref 150.0–400.0)
RBC: 4.14 Mil/uL (ref 3.87–5.11)
RDW: 11.8 % (ref 11.5–15.5)
WBC: 4.2 10*3/uL (ref 4.0–10.5)

## 2018-08-31 LAB — COMPREHENSIVE METABOLIC PANEL
ALT: 12 U/L (ref 0–35)
AST: 15 U/L (ref 0–37)
Albumin: 4.2 g/dL (ref 3.5–5.2)
Alkaline Phosphatase: 43 U/L (ref 39–117)
BILIRUBIN TOTAL: 0.5 mg/dL (ref 0.2–1.2)
BUN: 21 mg/dL (ref 6–23)
CALCIUM: 9.2 mg/dL (ref 8.4–10.5)
CHLORIDE: 103 meq/L (ref 96–112)
CO2: 30 mEq/L (ref 19–32)
Creatinine, Ser: 0.72 mg/dL (ref 0.40–1.20)
GFR: 88.11 mL/min (ref >60.00)
GLUCOSE: 88 mg/dL (ref 70–99)
POTASSIUM: 4 meq/L (ref 3.5–5.1)
Sodium: 139 mEq/L (ref 135–145)
Total Protein: 7.1 g/dL (ref 6.0–8.3)

## 2018-08-31 LAB — TSH: TSH: 2.14 u[IU]/mL (ref 0.35–4.50)

## 2018-08-31 LAB — LIPID PANEL
CHOL/HDL RATIO: 3
Cholesterol: 188 mg/dL (ref 0–200)
HDL: 61.9 mg/dL (ref >39.00)
LDL CALC: 109 mg/dL — AB (ref 0–99)
NONHDL: 125.6
TRIGLYCERIDES: 83 mg/dL (ref 0.0–149.0)
VLDL: 16.6 mg/dL (ref 0.0–40.0)

## 2018-09-07 ENCOUNTER — Ambulatory Visit (INDEPENDENT_AMBULATORY_CARE_PROVIDER_SITE_OTHER): Payer: BC Managed Care – PPO | Admitting: Family Medicine

## 2018-09-07 ENCOUNTER — Encounter: Payer: Self-pay | Admitting: Family Medicine

## 2018-09-07 VITALS — BP 106/64 | HR 58 | Temp 97.8°F | Ht 62.75 in | Wt 118.8 lb

## 2018-09-07 DIAGNOSIS — E039 Hypothyroidism, unspecified: Secondary | ICD-10-CM | POA: Diagnosis not present

## 2018-09-07 DIAGNOSIS — Z0000 Encounter for general adult medical examination without abnormal findings: Secondary | ICD-10-CM

## 2018-09-07 DIAGNOSIS — Z23 Encounter for immunization: Secondary | ICD-10-CM

## 2018-09-07 DIAGNOSIS — Z1211 Encounter for screening for malignant neoplasm of colon: Secondary | ICD-10-CM

## 2018-09-07 MED ORDER — LEVOTHYROXINE SODIUM 75 MCG PO TABS: ORAL_TABLET | ORAL | 3 refills | Status: DC

## 2018-09-07 NOTE — Patient Instructions (Addendum)
Start scheduling exercise - around child care schedule You will get more energy   If you are interested in the new shingles vaccine (Shingrix) - call your local pharmacy to check on coverage and availability  If affordable, get on a wait list at your pharmacy to get the vaccine. If covered - call here and get on our wait list as well    We will call you regarding the colonoscopy referral   For arm- always take ibuprofen with food

## 2018-09-07 NOTE — Progress Notes (Signed)
Subjective:    Patient ID: Ruth Gill, female    DOB: 08-02-1961, 57 y.o.   MRN: [id number]  HPI  Here for health maintenance exam and to review chronic medical problems    Caring for 2 grand kids (both 51 years old)  Very busy  Tired- but holding up ok  Has not been able to exercise due to child care    Wt Readings from Last 3 Encounters:  09/07/18 118 lb 12 oz (53.9 kg)  07/22/18 120 lb 12 oz (54.8 kg)  07/19/18 120 lb (54.4 kg)  is eating enough  Not enough exercise  21.20 kg/m   fobt neg 2016 She needs to schedule a colonoscopy    Pap 11/15 neg with neg HPV at gyn office  Had a partial hysterectomy  Last gyn visit 1/19  Has one scheduled in feb  Does pap every 3 y?   Flu shot -got it today   Mammogram 1/19 neg  Self breast exam - no lumps   Tetanus shot 4/15   Zoster status -had shingles when younger   R epicondylitis  Still on and off  Saw sport med at [employer] [facility]  occ ibuprofen   Hypothyroidism  Pt has no clinical changes No change in energy level/ hair or skin/ edema and no tremor Lab Results  Component Value Date   TSH 2.14 08/31/2018       cholesterol Lab Results  Component Value Date   CHOL 188 08/31/2018   CHOL 187 08/25/2017   CHOL 199 07/29/2016   Lab Results  Component Value Date   HDL 61.90 08/31/2018   HDL 53.50 08/25/2017   HDL 65.20 07/29/2016   Lab Results  Component Value Date   LDLCALC 109 (H) 08/31/2018   LDLCALC 119 (H) 08/25/2017   LDLCALC 118 (H) 07/29/2016   Lab Results  Component Value Date   TRIG 83.0 08/31/2018   TRIG 76.0 08/25/2017   TRIG 78.0 07/29/2016   Lab Results  Component Value Date   CHOLHDL 3 08/31/2018   CHOLHDL 4 08/25/2017   CHOLHDL 3 07/29/2016   No results found for: LDLDIRECT  Eating really healthy  Improved profile   Other labs Lab Results  Component Value Date   CREATININE 0.72 08/31/2018   BUN 21 08/31/2018   NA 139 08/31/2018   K 4.0 08/31/2018   CL 103 08/31/2018    CO2 30 08/31/2018   Lab Results  Component Value Date   WBC 4.2 08/31/2018   HGB 13.0 08/31/2018   HCT 38.3 08/31/2018   MCV 92.6 08/31/2018   PLT 184.0 08/31/2018   Lab Results  Component Value Date   ALT 12 08/31/2018   AST 15 08/31/2018   ALKPHOS 43 08/31/2018   BILITOT 0.5 08/31/2018      Patient Active Problem List   Diagnosis Date Noted  . Baker's cyst of knee, right 05/12/2017  . Lump of thigh, right 05/05/2017  . Hypothyroid 04/13/2014  . Colon cancer screening 01/12/2014  . Routine general medical examination at a health care facility 01/04/2014  . ALLERGIC RHINITIS 12/22/2007   Past Medical History:  Diagnosis Date  . Allergic rhinitis, cause unspecified   . Anemia, unspecified   . Mastodynia    Past Surgical History:  Procedure Laterality Date  . ABDOMINAL HYSTERECTOMY     Social History   Tobacco Use  . Smoking status: Never Smoker  . Smokeless tobacco: Never Used  Substance Use Topics  . Alcohol use: No  Alcohol/week: 0.0 standard drinks  . Drug use: No   History reviewed. No pertinent family history. Allergies  Allergen Reactions  . Azithromycin     REACTION: joint swelling, stiffness  . Penicillins     REACTION: SOB, rash   Current Outpatient Medications on File Prior to Visit  Medication Sig Dispense Refill  . Biotin 5000 MCG CAPS Take 1 capsule by mouth daily.    . Cholecalciferol (VITAMIN D) 2000 UNITS tablet Take 4,000 Units by mouth daily.     Marland Kitchen. estradiol (ESTRACE) 1 MG tablet Take 1 tablet by mouth daily.     . fexofenadine (ALLEGRA) 180 MG tablet Take 180 mg by mouth daily as needed.      . flintstones complete (FLINTSTONES) 60 MG chewable tablet Chew 1 tablet by mouth daily.    . fluticasone (FLONASE) 50 MCG/ACT nasal spray Place 2 sprays into both nostrils daily as needed. 16 g 11  . Omega-3 Fatty Acids (FISH OIL) 1200 MG CAPS Take 1 capsule by mouth daily.    Marland Kitchen. VAGIFEM 10 MCG TABS vaginal tablet Place 1 tablet vaginally 2  (two) times a week. TAKES AS NEEDED  11  . meclizine (ANTIVERT) 12.5 MG tablet Take 1 tablet (12.5 mg total) by mouth 3 (three) times daily as needed for dizziness. (Patient not taking: Reported on 09/07/2018) 15 tablet 0   No current facility-administered medications on file prior to visit.      Review of Systems  Constitutional: Positive for fatigue. Negative for activity change, appetite change, fever and unexpected weight change.       Fatigue from schedule   HENT: Negative for congestion, ear pain, rhinorrhea, sinus pressure and sore throat.   Eyes: Negative for pain, redness and visual disturbance.  Respiratory: Negative for cough, shortness of breath and wheezing.   Cardiovascular: Negative for chest pain and palpitations.  Gastrointestinal: Negative for abdominal pain, blood in stool, constipation and diarrhea.  Endocrine: Negative for polydipsia and polyuria.  Genitourinary: Negative for dysuria, frequency and urgency.  Musculoskeletal: Negative for arthralgias, back pain and myalgias.       Right elbow pain / epicondylitis   Skin: Negative for pallor and rash.  Allergic/Immunologic: Negative for environmental allergies.  Neurological: Negative for dizziness, syncope and headaches.  Hematological: Negative for adenopathy. Does not bruise/bleed easily.  Psychiatric/Behavioral: Negative for decreased concentration and dysphoric mood. The patient is not nervous/anxious.        Objective:   Physical Exam  Constitutional: She appears well-developed and well-nourished. No distress.  Slim and well appearing   HENT:  Head: Normocephalic and atraumatic.  Right Ear: External ear normal.  Left Ear: External ear normal.  Nose: Nose normal.  Mouth/Throat: Oropharynx is clear and moist.  Eyes: Pupils are equal, round, and reactive to light. Conjunctivae and EOM are normal. Right eye exhibits no discharge. Left eye exhibits no discharge. No scleral icterus.  Neck: Normal range of  motion. Neck supple. No JVD present. Carotid bruit is not present. No thyromegaly present.  Cardiovascular: Normal rate, regular rhythm, normal heart sounds and intact distal pulses. Exam reveals no gallop.  Pulmonary/Chest: Effort normal and breath sounds normal. No respiratory distress. She has no wheezes. She has no rales.  Abdominal: Soft. Bowel sounds are normal. She exhibits no distension and no mass. There is no tenderness.  Genitourinary:  Genitourinary Comments: Pt goes to gyn for breast/pelvic exam  Musculoskeletal: She exhibits no edema, tenderness or deformity.  No kyphosis   Lymphadenopathy:  She has no cervical adenopathy.  Neurological: She is alert. She has normal reflexes. No cranial nerve deficit. She exhibits normal muscle tone. Coordination normal.  Skin: Skin is warm and dry. No rash noted. No erythema. No pallor.  Solar lentigines diffusely  sks   Psychiatric: She has a normal mood and affect.  Pleasant           Assessment & Plan:   Problem List Items Addressed This Visit      Endocrine   Hypothyroid    Hypothyroidism  Pt has no clinical changes No change in energy level/ hair or skin/ edema and no tremor Lab Results  Component Value Date   TSH 2.14 08/31/2018          Relevant Medications   levothyroxine (SYNTHROID, LEVOTHROID) 75 MCG tablet     Other   Routine general medical examination at a health care facility - Primary    Reviewed health habits including diet and exercise and skin cancer prevention Reviewed appropriate screening tests for age  Also reviewed health mt list, fam hx and immunization status , as well as social and family history   See HPI Labs reviewed  Disc need for exercise/effort to fit it in Good health habits       Colon cancer screening    Ref done for colonoscopy  Office will call to set up       Relevant Orders   Ambulatory referral to Gastroenterology    Other Visit Diagnoses    Need for influenza  vaccination       Relevant Orders   Flu Vaccine QUAD 6+ mos PF IM (Fluarix Quad PF) (Completed)

## 2018-09-07 NOTE — Assessment & Plan Note (Signed)
Hypothyroidism  Pt has no clinical changes No change in energy level/ hair or skin/ edema and no tremor Lab Results  Component Value Date   TSH 2.14 08/31/2018

## 2018-09-07 NOTE — Assessment & Plan Note (Signed)
Reviewed health habits including diet and exercise and skin cancer prevention Reviewed appropriate screening tests for age  Also reviewed health mt list, fam hx and immunization status , as well as social and family history   See HPI Labs reviewed  Disc need for exercise/effort to fit it in Good health habits

## 2018-09-07 NOTE — Assessment & Plan Note (Signed)
Ref done for colonoscopy  Office will call to set up

## 2018-09-12 ENCOUNTER — Encounter: Payer: Self-pay | Admitting: Internal Medicine

## 2018-10-05 ENCOUNTER — Encounter: Payer: Self-pay | Admitting: Internal Medicine

## 2018-10-05 ENCOUNTER — Ambulatory Visit (AMBULATORY_SURGERY_CENTER): Payer: Self-pay | Admitting: *Deleted

## 2018-10-05 VITALS — Ht 63.0 in | Wt 120.0 lb

## 2018-10-05 DIAGNOSIS — Z1211 Encounter for screening for malignant neoplasm of colon: Secondary | ICD-10-CM

## 2018-10-05 MED ORDER — NA SULFATE-K SULFATE-MG SULF 17.5-3.13-1.6 GM/177ML PO SOLN: 1.0000 | Freq: Once | ORAL | 0 refills | Status: AC

## 2018-10-05 NOTE — Progress Notes (Signed)
No egg or soy allergy known to patient  No issues with past sedation with any surgeries  or procedures, no intubation problems  No diet pills per patient No home 02 use per patient  No blood thinners per patient  Pt denies issues with constipation  No A fib or A flutter  EMMI video sent to pt's e mail - Suprep $15 coupon to pt - online coupon

## 2018-10-18 ENCOUNTER — Encounter: Payer: Self-pay | Admitting: Internal Medicine

## 2018-10-18 ENCOUNTER — Ambulatory Visit (AMBULATORY_SURGERY_CENTER): Payer: BC Managed Care – PPO | Admitting: Internal Medicine

## 2018-10-18 VITALS — BP 123/72 | HR 68 | Temp 98.4°F | Resp 10 | Ht 62.0 in | Wt 118.0 lb

## 2018-10-18 DIAGNOSIS — Z1211 Encounter for screening for malignant neoplasm of colon: Secondary | ICD-10-CM

## 2018-10-18 MED ORDER — SODIUM CHLORIDE 0.9 % IV SOLN: 500.0000 mL | Freq: Once | INTRAVENOUS | Status: DC

## 2018-10-18 NOTE — Op Note (Signed)
[facility] Patient Name: Ruth Gill Procedure Date: 10/18/2018 11:12 AM MRN: [id number] Endoscopist: Wilhemina Bonito. Marina Goodell , MD Age: 58 Referring MD:  Date of Birth: [birth year] Gender: Female Account #: [id number] Procedure:                Colonoscopy Indications:              Screening for colorectal malignant neoplasm Medicines:                Monitored Anesthesia Care Procedure:                Pre-Anesthesia Assessment:                           - Prior to the procedure, a History and Physical                            was performed, and patient medications and                            allergies were reviewed. The patient's tolerance of                            previous anesthesia was also reviewed. The risks                            and benefits of the procedure and the sedation                            options and risks were discussed with the patient.                            All questions were answered, and informed consent                            was obtained. Prior Anticoagulants: The patient has                            taken no previous anticoagulant or antiplatelet                            agents. ASA Grade Assessment: II - A patient with                            mild systemic disease. After reviewing the risks                            and benefits, the patient was deemed in                            satisfactory condition to undergo the procedure.                           After obtaining informed consent, the colonoscope  was passed under direct vision. Throughout the                            procedure, the patient's blood pressure, pulse, and                            oxygen saturations were monitored continuously. The                            Colonoscope was introduced through the anus and                            advanced to the the cecum, identified by                            appendiceal orifice and  ileocecal valve. The                            ileocecal valve, appendiceal orifice, and rectum                            were photographed. The quality of the bowel                            preparation was excellent. The colonoscopy was                            performed without difficulty. The patient tolerated                            the procedure well. The bowel preparation used was                            SUPREP. Scope In: 11:22:02 AM Scope Out: 11:34:03 AM Scope Withdrawal Time: 0 hours 8 minutes 28 seconds  Total Procedure Duration: 0 hours 12 minutes 1 second  Findings:                 The entire examined colon appeared normal on direct                            and retroflexion views. Complications:            No immediate complications. Estimated blood loss:                            None. Estimated Blood Loss:     Estimated blood loss: none. Impression:               - The entire examined colon is normal on direct and                            retroflexion views.                           - No specimens collected. Recommendation:           -  Repeat colonoscopy in 10 years for screening                            purposes.                           - Patient has a contact number available for                            emergencies. The signs and symptoms of potential                            delayed complications were discussed with the                            patient. Return to normal activities tomorrow.                            Written discharge instructions were provided to the                            patient.                           - Resume previous diet.                           - Continue present medications. Wilhemina BonitoJohn N. Marina GoodellPerry, MD 10/18/2018 11:40:26 AM This report has been signed electronically.

## 2018-10-18 NOTE — Patient Instructions (Signed)
Thank you for allowing [place] to care for you today!  Resume previous diet and medications today.  Return to normal activities tomorrow.  Recommend next surveillance colonoscopy in 10 years.      YOU HAD AN ENDOSCOPIC PROCEDURE TODAY AT THE [facility]:   Refer to the procedure report that was given to you for any specific questions about what was found during the examination.  If the procedure report does not answer your questions, please call your gastroenterologist to clarify.  If you requested that your care partner not be given the details of your procedure findings, then the procedure report has been included in a sealed envelope for you to review at your convenience later.  YOU SHOULD EXPECT: Some feelings of bloating in the abdomen. Passage of more gas than usual.  Walking can help get rid of the air that was put into your GI tract during the procedure and reduce the bloating. If you had a lower endoscopy (such as a colonoscopy or flexible sigmoidoscopy) you may notice spotting of blood in your stool or on the toilet paper. If you underwent a bowel prep for your procedure, you may not have a normal bowel movement for a few days.  Please Note:  You might notice some irritation and congestion in your nose or some drainage.  This is from the oxygen used during your procedure.  There is no need for concern and it should clear up in a day or so.  SYMPTOMS TO REPORT IMMEDIATELY:   Following lower endoscopy (colonoscopy or flexible sigmoidoscopy):  Excessive amounts of blood in the stool  Significant tenderness or worsening of abdominal pains  Swelling of the abdomen that is new, acute  Fever of 100F or higher   For urgent or emergent issues, a gastroenterologist can be reached at any hour by calling [phone].   DIET:  We do recommend a small meal at first, but then you may proceed to your regular diet.  Drink plenty of fluids but you should avoid alcoholic beverages for  24 hours.  ACTIVITY:  You should plan to take it easy for the rest of today and you should NOT DRIVE or use heavy machinery until tomorrow (because of the sedation medicines used during the test).    FOLLOW UP: Our staff will call the number listed on your records the next business day following your procedure to check on you and address any questions or concerns that you may have regarding the information given to you following your procedure. If we do not reach you, we will leave a message.  However, if you are feeling well and you are not experiencing any problems, there is no need to return our call.  We will assume that you have returned to your regular daily activities without incident.  If any biopsies were taken you will be contacted by phone or by letter within the next 1-3 weeks.  Please call us at [phone] if you have not heard about the biopsies in 3 weeks.    SIGNATURES/CONFIDENTIALITY: You and/or your care partner have signed paperwork which will be entered into your electronic medical record.  These signatures attest to the fact that that the information above on your After Visit Summary has been reviewed and is understood.  Full responsibility of the confidentiality of this discharge information lies with you and/or your care-partner.

## 2018-10-18 NOTE — Progress Notes (Signed)
Alert and oriented x3, pleased with MAC, report to RN  

## 2018-10-18 NOTE — Progress Notes (Signed)
Pt's states no medical or surgical changes since previsit or office visit. 

## 2018-10-19 ENCOUNTER — Telehealth: Payer: Self-pay

## 2018-10-19 NOTE — Telephone Encounter (Signed)
  Follow up Call-  Call back number 10/18/2018  Post procedure Call Back phone  # [phone]  Permission to leave phone message Yes  Some recent data might be hidden     Patient questions:  Do you have a fever, pain , or abdominal swelling? No. Pain Score  0 *  Have you tolerated food without any problems? Yes.    Have you been able to return to your normal activities? Yes.    Do you have any questions about your discharge instructions: Diet   No. Medications  No. Follow up visit  No.  Do you have questions or concerns about your Care? No.  Actions: * If pain score is 4 or above: No action needed, pain <4.

## 2018-10-19 NOTE — Telephone Encounter (Signed)
Unable to leave message on f/u call due to voicemail being full.

## 2019-01-31 ENCOUNTER — Encounter: Payer: Self-pay | Admitting: Family Medicine

## 2019-01-31 ENCOUNTER — Ambulatory Visit (INDEPENDENT_AMBULATORY_CARE_PROVIDER_SITE_OTHER): Payer: BC Managed Care – PPO | Admitting: Family Medicine

## 2019-01-31 DIAGNOSIS — T63451A Toxic effect of venom of hornets, accidental (unintentional), initial encounter: Secondary | ICD-10-CM

## 2019-01-31 DIAGNOSIS — T63441A Toxic effect of venom of bees, accidental (unintentional), initial encounter: Secondary | ICD-10-CM | POA: Diagnosis not present

## 2019-01-31 DIAGNOSIS — T63461A Toxic effect of venom of wasps, accidental (unintentional), initial encounter: Secondary | ICD-10-CM

## 2019-01-31 MED ORDER — DOXYCYCLINE HYCLATE 100 MG PO TABS: 100.0000 mg | ORAL_TABLET | Freq: Two times a day (BID) | ORAL | 0 refills | Status: DC

## 2019-01-31 MED ORDER — TRIAMCINOLONE ACETONIDE 0.5 % EX CREA: 1.0000 "application " | TOPICAL_CREAM | Freq: Three times a day (TID) | CUTANEOUS | 1 refills | Status: DC

## 2019-01-31 NOTE — Patient Instructions (Signed)
Keep affected area clean soap and water) and dry  Use the triamcinolone cream three times daily  Also take doxycycline as directed  Elevation may help  Antihistamine (allegra) is ok for itch Ibuprofen -for swelling and pain   Let me know if worse redness/rash/swelling or pain at any time of if any streaks of redness develop Also if fever  Let me know and go to ER if any shortness of breath or mouth/throat/face swelling   Update if not starting to improve in a week or if worsening

## 2019-01-31 NOTE — Assessment & Plan Note (Signed)
I suspect pt had sting from a yellow jacket or wasp that does not leave a stinger-given the amount of pain  She may be having allergic reaction but I cannot also r/o infection given the amount of redness or swelling Will cover with doxycycline Also triamcinolone cream for itch/swelling Continue ice/cool compress nsaid and antihistamine ok as well  Watch for swelling of mouth or face or any sob-inst to go to ER if this occurs  Also watch for inc pain/redness/swelling or streaking  Update if not starting to improve in a week or if worsening

## 2019-01-31 NOTE — Progress Notes (Signed)
Virtual Visit via Video Note  I connected with Ruth Gill on 01/31/19 at 12:00 PM EDT by a video enabled telemedicine application and verified that I am speaking with the correct person using two identifiers.  Location: Patient: home Provider: office    I discussed the limitations of evaluation and management by telemedicine and the availability of in person appointments. The patient expressed understanding and agreed to proceed.  History of Present Illness: Pt presents with a bug bite   Was watering fern outdoors Sunday am  Felt a sting Looked like a mosquito bite  Did not see or feel a stinger at the time and did not see the insect  Ice  Ibuprofen Benadryl -sedates her  Allegra   Swollen with a little rash Spreaded to wrist and elbow  More sore than itchy  No pus or discharge  No history of all rxn to insects No swelling or wheezing /no facial swelling  Review of Systems  Constitutional: Negative for chills, diaphoresis, fever, malaise/fatigue and weight loss.  HENT: Negative for sore throat.   Eyes: Negative for discharge.  Respiratory: Negative for cough, shortness of breath and wheezing.   Cardiovascular: Negative for chest pain and palpitations.  Gastrointestinal: Negative for nausea.  Musculoskeletal: Negative for myalgias.  Skin: Positive for itching and rash.  Neurological: Negative for dizziness and headaches.  Endo/Heme/Allergies: Positive for environmental allergies.        Patient Active Problem List   Diagnosis Date Noted  . Sting from hornet, wasp, or bee 01/31/2019  . Baker's cyst of knee, right 05/12/2017  . Hypothyroid 04/13/2014  . Colon cancer screening 01/12/2014  . Routine general medical examination at a health care facility 01/04/2014  . ALLERGIC RHINITIS 12/22/2007   Past Medical History:  Diagnosis Date  . Allergic rhinitis, cause unspecified   . Allergy   . Anemia, unspecified   . Heart murmur    MVP  . Mastodynia   .  Thyroid disease    hypothyroid   Past Surgical History:  Procedure Laterality Date  . ABDOMINAL HYSTERECTOMY    . MOUTH SURGERY     Social History   Tobacco Use  . Smoking status: Never Smoker  . Smokeless tobacco: Never Used  Substance Use Topics  . Alcohol use: No    Alcohol/week: 0.0 standard drinks  . Drug use: No   Family History  Problem Relation Age of Onset  . Colon cancer Neg Hx   . Colon polyps Neg Hx   . Esophageal cancer Neg Hx   . Rectal cancer Neg Hx   . Stomach cancer Neg Hx    Allergies  Allergen Reactions  . Azithromycin     REACTION: joint swelling, stiffness  . Penicillins     REACTION: SOB, rash   Current Outpatient Medications on File Prior to Visit  Medication Sig Dispense Refill  . Biotin 5000 MCG CAPS Take 1 capsule by mouth daily.    . Cholecalciferol (VITAMIN D) 2000 UNITS tablet Take 4,000 Units by mouth daily.     Marland Kitchen. estradiol (ESTRACE) 1 MG tablet Take 1 tablet by mouth daily.     . fexofenadine (ALLEGRA) 180 MG tablet Take 180 mg by mouth daily as needed.      . flintstones complete (FLINTSTONES) 60 MG chewable tablet Chew 1 tablet by mouth daily. Alternates with Vitafusion multivites daily    . fluticasone (FLONASE) 50 MCG/ACT nasal spray Place 2 sprays into both nostrils daily as needed. (Patient not taking:  Reported on 10/18/2018) 16 g 11  . levothyroxine (SYNTHROID, LEVOTHROID) 75 MCG tablet TAKE 1 TABLET (75 MCG TOTAL) BY MOUTH DAILY. 90 tablet 3  . meclizine (ANTIVERT) 12.5 MG tablet Take 1 tablet (12.5 mg total) by mouth 3 (three) times daily as needed for dizziness. (Patient not taking: Reported on 10/18/2018) 15 tablet 0  . Omega-3 Fatty Acids (FISH OIL) 1200 MG CAPS Take 1 capsule by mouth daily.    Marland Kitchen VAGIFEM 10 MCG TABS vaginal tablet Place 1 tablet vaginally 2 (two) times a week. TAKES AS NEEDED  11   No current facility-administered medications on file prior to visit.     Observations/Objective: Patient appears well /like her  normal self  Pleasant and talkative  No facial or eye swelling or asymmetry No speech change/slurring or hoarseness No throat clearing or cough  No sob on interview  Pleasant/normal affect and not distressed   Assessment and Plan: Problem List Items Addressed This Visit      Other   Sting from hornet, wasp, or bee    I suspect pt had sting from a yellow jacket or wasp that does not leave a stinger-given the amount of pain  She may be having allergic reaction but I cannot also r/o infection given the amount of redness or swelling Will cover with doxycycline Also triamcinolone cream for itch/swelling Continue ice/cool compress nsaid and antihistamine ok as well  Watch for swelling of mouth or face or any sob-inst to go to ER if this occurs  Also watch for inc pain/redness/swelling or streaking  Update if not starting to improve in a week or if worsening            Follow Up Instructions: Keep affected area clean soap and water) and dry  Use the triamcinolone cream three times daily  Also take doxycycline as directed  Elevation may help  Antihistamine (allegra) is ok for itch Ibuprofen -for swelling and pain   Let me know if worse redness/rash/swelling or pain at any time of if any streaks of redness develop Also if fever  Let me know and go to ER if any shortness of breath or mouth/throat/face swelling   Update if not starting to improve in a week or if worsening     I discussed the assessment and treatment plan with the patient. The patient was provided an opportunity to ask questions and all were answered. The patient agreed with the plan and demonstrated an understanding of the instructions.   The patient was advised to call back or seek an in-person evaluation if the symptoms worsen or if the condition fails to improve as anticipated.    Roxy Manns, MD

## 2019-09-03 ENCOUNTER — Telehealth: Payer: Self-pay | Admitting: Family Medicine

## 2019-09-03 DIAGNOSIS — E039 Hypothyroidism, unspecified: Secondary | ICD-10-CM

## 2019-09-03 DIAGNOSIS — Z0000 Encounter for general adult medical examination without abnormal findings: Secondary | ICD-10-CM

## 2019-09-03 NOTE — Telephone Encounter (Signed)
-----   Message from Ellamae Sia sent at 08/30/2019  8:39 AM EST ----- Regarding: Lab orders for Tuesday, 12.8.20 Patient is scheduled for CPX labs, please order future labs, Thanks , Karna Christmas

## 2019-09-05 ENCOUNTER — Other Ambulatory Visit (INDEPENDENT_AMBULATORY_CARE_PROVIDER_SITE_OTHER): Payer: BC Managed Care – PPO

## 2019-09-05 ENCOUNTER — Other Ambulatory Visit: Payer: Self-pay

## 2019-09-05 DIAGNOSIS — E039 Hypothyroidism, unspecified: Secondary | ICD-10-CM

## 2019-09-05 DIAGNOSIS — Z0000 Encounter for general adult medical examination without abnormal findings: Secondary | ICD-10-CM | POA: Diagnosis not present

## 2019-09-05 LAB — COMPREHENSIVE METABOLIC PANEL
ALT: 13 U/L (ref 0–35)
AST: 16 U/L (ref 0–37)
Albumin: 4.1 g/dL (ref 3.5–5.2)
Alkaline Phosphatase: 46 U/L (ref 39–117)
BUN: 18 mg/dL (ref 6–23)
CO2: 30 mEq/L (ref 19–32)
Calcium: 8.9 mg/dL (ref 8.4–10.5)
Chloride: 104 mEq/L (ref 96–112)
Creatinine, Ser: 0.73 mg/dL (ref 0.40–1.20)
GFR: 81.31 mL/min (ref >60.00)
Glucose, Bld: 84 mg/dL (ref 70–99)
Potassium: 3.9 mEq/L (ref 3.5–5.1)
Sodium: 139 mEq/L (ref 135–145)
Total Bilirubin: 0.5 mg/dL (ref 0.2–1.2)
Total Protein: 6.7 g/dL (ref 6.0–8.3)

## 2019-09-05 LAB — CBC WITH DIFFERENTIAL/PLATELET
Basophils Absolute: 0 10*3/uL (ref 0.0–0.1)
Basophils Relative: 1 % (ref 0.0–3.0)
Eosinophils Absolute: 0.2 10*3/uL (ref 0.0–0.7)
Eosinophils Relative: 3.8 % (ref 0.0–5.0)
HCT: 37.1 % (ref 36.0–46.0)
Hemoglobin: 12.4 g/dL (ref 12.0–15.0)
Lymphocytes Relative: 48.9 % — ABNORMAL HIGH (ref 12.0–46.0)
Lymphs Abs: 2.3 10*3/uL (ref 0.7–4.0)
MCHC: 33.5 g/dL (ref 30.0–36.0)
MCV: 93.6 fl (ref 78.0–100.0)
Monocytes Absolute: 0.4 10*3/uL (ref 0.1–1.0)
Monocytes Relative: 8 % (ref 3.0–12.0)
Neutro Abs: 1.8 10*3/uL (ref 1.4–7.7)
Neutrophils Relative %: 38.3 % — ABNORMAL LOW (ref 43.0–77.0)
Platelets: 168 10*3/uL (ref 150.0–400.0)
RBC: 3.96 Mil/uL (ref 3.87–5.11)
RDW: 12.1 % (ref 11.5–15.5)
WBC: 4.6 10*3/uL (ref 4.0–10.5)

## 2019-09-05 LAB — LIPID PANEL
Cholesterol: 186 mg/dL (ref 0–200)
HDL: 62.4 mg/dL (ref >39.00)
LDL Cholesterol: 110 mg/dL — ABNORMAL HIGH (ref 0–99)
NonHDL: 123.78
Total CHOL/HDL Ratio: 3
Triglycerides: 68 mg/dL (ref 0.0–149.0)
VLDL: 13.6 mg/dL (ref 0.0–40.0)

## 2019-09-05 LAB — TSH: TSH: 4.16 u[IU]/mL (ref 0.35–4.50)

## 2019-09-11 ENCOUNTER — Encounter: Payer: Self-pay | Admitting: Family Medicine

## 2019-09-11 ENCOUNTER — Other Ambulatory Visit: Payer: Self-pay

## 2019-09-11 ENCOUNTER — Ambulatory Visit (INDEPENDENT_AMBULATORY_CARE_PROVIDER_SITE_OTHER): Payer: BC Managed Care – PPO | Admitting: Family Medicine

## 2019-09-11 VITALS — BP 122/70 | HR 64 | Temp 97.2°F | Ht 62.25 in | Wt 120.4 lb

## 2019-09-11 DIAGNOSIS — Z23 Encounter for immunization: Secondary | ICD-10-CM

## 2019-09-11 DIAGNOSIS — Z0000 Encounter for general adult medical examination without abnormal findings: Secondary | ICD-10-CM | POA: Diagnosis not present

## 2019-09-11 DIAGNOSIS — E039 Hypothyroidism, unspecified: Secondary | ICD-10-CM | POA: Diagnosis not present

## 2019-09-11 MED ORDER — LEVOTHYROXINE SODIUM 75 MCG PO TABS: ORAL_TABLET | ORAL | 3 refills | Status: DC

## 2019-09-11 NOTE — Assessment & Plan Note (Signed)
Reviewed health habits including diet and exercise and skin cancer prevention Reviewed appropriate screening tests for age  Also reviewed health mt list, fam hx and immunization status , as well as social and family history   Enc pt to continue good health habits Consider trial of diclofenac gel otc for aches and pains  BP is better on 2nd check  Flu shot given today  See gyn in February as planned  Sent for last mammogram report from gyn (2/20) Discussed shingrix vaccine- she plans to check on coverage  Encouraged to keep up good diet and exercise

## 2019-09-11 NOTE — Assessment & Plan Note (Signed)
Hypothyroidism  Pt has no clinical changes No change in energy level/ hair or skin/ edema and no tremor Lab Results  Component Value Date   TSH 4.16 09/05/2019

## 2019-09-11 NOTE — Progress Notes (Signed)
Subjective:    Patient ID: Ruth RhymeBelinda M Gill, female    DOB: 1961/09/25, 58 y.o.   MRN: [id number]  This visit occurred during the SARS-CoV-2 public health emergency.  Safety protocols were in place, including screening questions prior to the visit, additional usage of staff PPE, and extensive cleaning of exam room while observing appropriate contact time as indicated for disinfecting solutions.    HPI Here for health maintenance exam and to review chronic medical problems    Doing ok  Nothing new except her R ear feels full on and off for the past month  Has golf elbow both sides / also base of thumbs  Taking ibuprofen   Wt Readings from Last 3 Encounters:  09/11/19 120 lb 6 oz (54.6 kg)  10/18/18 118 lb (53.5 kg)  10/05/18 120 lb (54.4 kg)  working out  [employer] Financial[employer]t is stable  Eating healthy also  21.84 kg/m   Colon cancer screen  1/20 colonoscopy with 10 y recall    Pap 11/15 -gyn Will go back in feb/ and do not do pap tests  Neg with neg HPV screen Has had a hysterectomy   Flu vaccine -given today  Mammogram 1/19 , had one in feb 2020  Self breast exam - no lumps   Tdap 4/15  Zoster status -she has already had shingles once  Is interested in the vaccine   BP Readings from Last 3 Encounters:  09/11/19 (!) 144/88  10/18/18 123/72  09/07/18 106/64  taking ibuprofen  Better on 2nd check BP: 122/70   Pulse Readings from Last 3 Encounters:  09/11/19 64  10/18/18 68  09/07/18 (!) 58    Hypothyroidism  Pt has no clinical changes No change in energy level/ hair or skin/ edema and no tremor Lab Results  Component Value Date   TSH 4.16 09/05/2019     Cholesterol  Lab Results  Component Value Date   CHOL 186 09/05/2019   CHOL 188 08/31/2018   CHOL 187 08/25/2017   Lab Results  Component Value Date   HDL 62.40 09/05/2019   HDL 61.90 08/31/2018   HDL 53.50 08/25/2017   Lab Results  Component Value Date   LDLCALC 110 (H) 09/05/2019   LDLCALC 109 (H)  08/31/2018   LDLCALC 119 (H) 08/25/2017   Lab Results  Component Value Date   TRIG 68.0 09/05/2019   TRIG 83.0 08/31/2018   TRIG 76.0 08/25/2017   Lab Results  Component Value Date   CHOLHDL 3 09/05/2019   CHOLHDL 3 08/31/2018   CHOLHDL 4 08/25/2017   No results found for: LDLDIRECT  Other labs Lab Results  Component Value Date   CREATININE 0.73 09/05/2019   BUN 18 09/05/2019   NA 139 09/05/2019   K 3.9 09/05/2019   CL 104 09/05/2019   CO2 30 09/05/2019   Lab Results  Component Value Date   ALT 13 09/05/2019   AST 16 09/05/2019   ALKPHOS 46 09/05/2019   BILITOT 0.5 09/05/2019   Lab Results  Component Value Date   WBC 4.6 09/05/2019   HGB 12.4 09/05/2019   HCT 37.1 09/05/2019   MCV 93.6 09/05/2019   PLT 168.0 09/05/2019   Patient Active Problem List   Diagnosis Date Noted  . Baker's cyst of knee, right 05/12/2017  . Hypothyroid 04/13/2014  . Colon cancer screening 01/12/2014  . Routine general medical examination at a health care facility 01/04/2014  . ALLERGIC RHINITIS 12/22/2007   Past Medical History:  Diagnosis  Date  . Allergic rhinitis, cause unspecified   . Allergy   . Anemia, unspecified   . Heart murmur    MVP  . Mastodynia   . Thyroid disease    hypothyroid   Past Surgical History:  Procedure Laterality Date  . ABDOMINAL HYSTERECTOMY    . MOUTH SURGERY     Social History   Tobacco Use  . Smoking status: Never Smoker  . Smokeless tobacco: Never Used  Substance Use Topics  . Alcohol use: No    Alcohol/week: 0.0 standard drinks  . Drug use: No   Family History  Problem Relation Age of Onset  . Colon cancer Neg Hx   . Colon polyps Neg Hx   . Esophageal cancer Neg Hx   . Rectal cancer Neg Hx   . Stomach cancer Neg Hx    Allergies  Allergen Reactions  . Azithromycin     REACTION: joint swelling, stiffness  . Penicillins     REACTION: SOB, rash   Current Outpatient Medications on File Prior to Visit  Medication Sig Dispense  Refill  . Biotin 5000 MCG CAPS Take 1 capsule by mouth daily.    . Cholecalciferol (VITAMIN D) 2000 UNITS tablet Take 4,000 Units by mouth daily.     Marland Kitchen estradiol (ESTRACE) 1 MG tablet Take 1 tablet by mouth daily.     . fexofenadine (ALLEGRA) 180 MG tablet Take 180 mg by mouth daily as needed.      . flintstones complete (FLINTSTONES) 60 MG chewable tablet Chew 1 tablet by mouth daily. Alternates with Vitafusion multivites daily    . fluticasone (FLONASE) 50 MCG/ACT nasal spray Place 2 sprays into both nostrils daily as needed. 16 g 11  . meclizine (ANTIVERT) 12.5 MG tablet Take 1 tablet (12.5 mg total) by mouth 3 (three) times daily as needed for dizziness. 15 tablet 0  . Omega-3 Fatty Acids (FISH OIL) 1200 MG CAPS Take 1 capsule by mouth daily.     No current facility-administered medications on file prior to visit.      Review of Systems  Constitutional: Negative for activity change, appetite change, fatigue, fever and unexpected weight change.  HENT: Negative for congestion, ear pain, rhinorrhea, sinus pressure and sore throat.        Full feeling ear right   Eyes: Negative for pain, redness and visual disturbance.  Respiratory: Negative for cough, shortness of breath and wheezing.   Cardiovascular: Negative for chest pain and palpitations.  Gastrointestinal: Negative for abdominal pain, blood in stool, constipation and diarrhea.  Endocrine: Negative for polydipsia and polyuria.  Genitourinary: Negative for dysuria, frequency and urgency.  Musculoskeletal: Positive for arthralgias. Negative for back pain and myalgias.       Base of thumb  Skin: Negative for pallor and rash.  Allergic/Immunologic: Negative for environmental allergies.  Neurological: Negative for dizziness, syncope and headaches.  Hematological: Negative for adenopathy. Does not bruise/bleed easily.  Psychiatric/Behavioral: Negative for decreased concentration and dysphoric mood. The patient is not nervous/anxious.          Objective:   Physical Exam Constitutional:      General: She is not in acute distress.    Appearance: Normal appearance. She is well-developed and normal weight. She is not ill-appearing or diaphoretic.     Comments: Slim and well appearing   HENT:     Head: Normocephalic and atraumatic.     Right Ear: Tympanic membrane, ear canal and external ear normal.     Left  Ear: Tympanic membrane, ear canal and external ear normal.     Nose: Nose normal. No congestion.     Mouth/Throat:     Mouth: Mucous membranes are moist.     Pharynx: Oropharynx is clear. No posterior oropharyngeal erythema.  Eyes:     General: No scleral icterus.    Extraocular Movements: Extraocular movements intact.     Conjunctiva/sclera: Conjunctivae normal.     Pupils: Pupils are equal, round, and reactive to light.  Neck:     Thyroid: No thyromegaly.     Vascular: No carotid bruit or JVD.  Cardiovascular:     Rate and Rhythm: Normal rate and regular rhythm.     Pulses: Normal pulses.     Heart sounds: Normal heart sounds. No gallop.   Pulmonary:     Effort: Pulmonary effort is normal. No respiratory distress.     Breath sounds: Normal breath sounds. No wheezing.     Comments: Good air exch Chest:     Chest wall: No tenderness.  Abdominal:     General: Bowel sounds are normal. There is no distension or abdominal bruit.     Palpations: Abdomen is soft. There is no mass.     Tenderness: There is no abdominal tenderness.     Hernia: No hernia is present.  Genitourinary:    Comments: Breast exam: No mass, nodules, thickening, tenderness, bulging, retraction, inflamation, nipple discharge or skin changes noted.  No axillary or clavicular LA.     Musculoskeletal:        General: No tenderness. Normal range of motion.     Cervical back: Normal range of motion and neck supple. No rigidity. No muscular tenderness.     Right lower leg: No edema.     Left lower leg: No edema.     Comments: No kyphosis    Lymphadenopathy:     Cervical: No cervical adenopathy.  Skin:    General: Skin is warm and dry.     Coloration: Skin is not pale.     Findings: No erythema or rash.     Comments: Solar lentigines diffusely  Few angiomas  Neurological:     Mental Status: She is alert. Mental status is at baseline.     Cranial Nerves: No cranial nerve deficit.     Motor: No abnormal muscle tone.     Coordination: Coordination normal.     Gait: Gait normal.     Deep Tendon Reflexes: Reflexes are normal and symmetric. Reflexes normal.  Psychiatric:        Mood and Affect: Mood normal.        Cognition and Memory: Cognition and memory normal.           Assessment & Plan:   Problem List Items Addressed This Visit      Endocrine   Hypothyroid    Hypothyroidism  Pt has no clinical changes No change in energy level/ hair or skin/ edema and no tremor Lab Results  Component Value Date   TSH 4.16 09/05/2019          Relevant Medications   levothyroxine (SYNTHROID) 75 MCG tablet     Other   Routine general medical examination at a health care facility - Primary    Reviewed health habits including diet and exercise and skin cancer prevention Reviewed appropriate screening tests for age  Also reviewed health mt list, fam hx and immunization status , as well as social and family history   Enc pt  to continue good health habits Consider trial of diclofenac gel otc for aches and pains  BP is better on 2nd check  Flu shot given today  See gyn in February as planned  Sent for last mammogram report from gyn (2/20) Discussed shingrix vaccine- she plans to check on coverage  Encouraged to keep up good diet and exercise          Other Visit Diagnoses    Need for influenza vaccination       Relevant Orders   Flu Vaccine QUAD 6+ mos PF IM (Fluarix Quad PF) (Completed)

## 2019-09-11 NOTE — Patient Instructions (Addendum)
Tylenol is safer than ibuprofen   There is a topical gel over the counter called diclofenac that is good for aches and pains as well   If you are interested in the shingles vaccine series (Shingrix), call your insurance or pharmacy to check on coverage and location it must be given.  If affordable - you can schedule it here or at your pharmacy depending on coverage    Flu shot today   Take care of yourself

## 2019-11-24 ENCOUNTER — Ambulatory Visit: Payer: BC Managed Care – PPO | Admitting: Family Medicine

## 2019-11-24 ENCOUNTER — Encounter: Payer: Self-pay | Admitting: Family Medicine

## 2019-11-24 ENCOUNTER — Other Ambulatory Visit: Payer: Self-pay

## 2019-11-24 VITALS — BP 124/70 | HR 65 | Temp 97.8°F | Wt 125.5 lb

## 2019-11-24 DIAGNOSIS — S39012A Strain of muscle, fascia and tendon of lower back, initial encounter: Secondary | ICD-10-CM

## 2019-11-24 NOTE — Patient Instructions (Signed)
Please use ibuprofen 400-600 mg every 8 to 12 hours for inflammation as needed Can use heat as needed If worsening, or any change in bowel movements/ urine please call the office  Low Back Sprain or Strain Rehab Ask your health care provider which exercises are safe for you. Do exercises exactly as told by your health care provider and adjust them as directed. It is normal to feel mild stretching, pulling, tightness, or discomfort as you do these exercises. Stop right away if you feel sudden pain or your pain gets worse. Do not begin these exercises until told by your health care provider. Stretching and range-of-motion exercises These exercises warm up your muscles and joints and improve the movement and flexibility of your back. These exercises also help to relieve pain, numbness, and tingling. Lumbar rotation  1. Lie on your back on a firm surface and bend your knees. 2. Straighten your arms out to your sides so each arm forms a 90-degree angle (right angle) with a side of your body. 3. Slowly move (rotate) both of your knees to one side of your body until you feel a stretch in your lower back (lumbar). Try not to let your shoulders lift off the floor. 4. Hold this position for __________ seconds. 5. Tense your abdominal muscles and slowly move your knees back to the starting position. 6. Repeat this exercise on the other side of your body. Repeat __________ times. Complete this exercise __________ times a day. Single knee to chest  1. Lie on your back on a firm surface with both legs straight. 2. Bend one of your knees. Use your hands to move your knee up toward your chest until you feel a gentle stretch in your lower back and buttock. ? Hold your leg in this position by holding on to the front of your knee. ? Keep your other leg as straight as possible. 3. Hold this position for __________ seconds. 4. Slowly return to the starting position. 5. Repeat with your other leg. Repeat  __________ times. Complete this exercise __________ times a day. Prone extension on elbows  1. Lie on your abdomen on a firm surface (prone position). 2. Prop yourself up on your elbows. 3. Use your arms to help lift your chest up until you feel a gentle stretch in your abdomen and your lower back. ? This will place some of your body weight on your elbows. If this is uncomfortable, try stacking pillows under your chest. ? Your hips should stay down, against the surface that you are lying on. Keep your hip and back muscles relaxed. 4. Hold this position for __________ seconds. 5. Slowly relax your upper body and return to the starting position. Repeat __________ times. Complete this exercise __________ times a day. Strengthening exercises These exercises build strength and endurance in your back. Endurance is the ability to use your muscles for a long time, even after they get tired. Pelvic tilt This exercise strengthens the muscles that lie deep in the abdomen. 1. Lie on your back on a firm surface. Bend your knees and keep your feet flat on the floor. 2. Tense your abdominal muscles. Tip your pelvis up toward the ceiling and flatten your lower back into the floor. ? To help with this exercise, you may place a small towel under your lower back and try to push your back into the towel. 3. Hold this position for __________ seconds. 4. Let your muscles relax completely before you repeat this exercise. Repeat __________  times. Complete this exercise __________ times a day. Alternating arm and leg raises  1. Get on your hands and knees on a firm surface. If you are on a hard floor, you may want to use padding, such as an exercise mat, to cushion your knees. 2. Line up your arms and legs. Your hands should be directly below your shoulders, and your knees should be directly below your hips. 3. Lift your left leg behind you. At the same time, raise your right arm and straighten it in front of  you. ? Do not lift your leg higher than your hip. ? Do not lift your arm higher than your shoulder. ? Keep your abdominal and back muscles tight. ? Keep your hips facing the ground. ? Do not arch your back. ? Keep your balance carefully, and do not hold your breath. 4. Hold this position for __________ seconds. 5. Slowly return to the starting position. 6. Repeat with your right leg and your left arm. Repeat __________ times. Complete this exercise __________ times a day. Abdominal set with straight leg raise  1. Lie on your back on a firm surface. 2. Bend one of your knees and keep your other leg straight. 3. Tense your abdominal muscles and lift your straight leg up, 4-6 inches (10-15 cm) off the ground. 4. Keep your abdominal muscles tight and hold this position for __________ seconds. ? Do not hold your breath. ? Do not arch your back. Keep it flat against the ground. 5. Keep your abdominal muscles tense as you slowly lower your leg back to the starting position. 6. Repeat with your other leg. Repeat __________ times. Complete this exercise __________ times a day. Single leg lower with bent knees 1. Lie on your back on a firm surface. 2. Tense your abdominal muscles and lift your feet off the floor, one foot at a time, so your knees and hips are bent in 90-degree angles (right angles). ? Your knees should be over your hips and your lower legs should be parallel to the floor. 3. Keeping your abdominal muscles tense and your knee bent, slowly lower one of your legs so your toe touches the ground. 4. Lift your leg back up to return to the starting position. ? Do not hold your breath. ? Do not let your back arch. Keep your back flat against the ground. 5. Repeat with your other leg. Repeat __________ times. Complete this exercise __________ times a day. Posture and body mechanics Good posture and healthy body mechanics can help to relieve stress in your body's tissues and joints.  Body mechanics refers to the movements and positions of your body while you do your daily activities. Posture is part of body mechanics. Good posture means:  Your spine is in its natural S-curve position (neutral).  Your shoulders are pulled back slightly.  Your head is not tipped forward. Follow these guidelines to improve your posture and body mechanics in your everyday activities. Standing   When standing, keep your spine neutral and your feet about hip width apart. Keep a slight bend in your knees. Your ears, shoulders, and hips should line up.  When you do a task in which you stand in one place for a long time, place one foot up on a stable object that is 2-4 inches (5-10 cm) high, such as a footstool. This helps keep your spine neutral. Sitting   When sitting, keep your spine neutral and keep your feet flat on the floor. Use a  footrest, if necessary, and keep your thighs parallel to the floor. Avoid rounding your shoulders, and avoid tilting your head forward.  When working at a desk or a computer, keep your desk at a height where your hands are slightly lower than your elbows. Slide your chair under your desk so you are close enough to maintain good posture.  When working at a computer, place your monitor at a height where you are looking straight ahead and you do not have to tilt your head forward or downward to look at the screen. Resting  When lying down and resting, avoid positions that are most painful for you.  If you have pain with activities such as sitting, bending, stooping, or squatting, lie in a position in which your body does not bend very much. For example, avoid curling up on your side with your arms and knees near your chest (fetal position).  If you have pain with activities such as standing for a long time or reaching with your arms, lie with your spine in a neutral position and bend your knees slightly. Try the following positions: ? Lying on your side with a  pillow between your knees. ? Lying on your back with a pillow under your knees. Lifting   When lifting objects, keep your feet at least shoulder width apart and tighten your abdominal muscles.  Bend your knees and hips and keep your spine neutral. It is important to lift using the strength of your legs, not your back. Do not lock your knees straight out.  Always ask for help to lift heavy or awkward objects. This information is not intended to replace advice given to you by your health care provider. Make sure you discuss any questions you have with your health care provider. Document Revised: 01/06/2019 Document Reviewed: 10/06/2018 Elsevier Patient Education  2020 [employer].

## 2019-11-24 NOTE — Progress Notes (Signed)
   Subjective:    Patient ID: Ruth Gill, female    DOB: 07/15/1961, 59 y.o.   MRN: [id number]  HPI Chief Complaint  Patient presents with  . Back Pain    started on 11/19/19. Rt lower side, pain radiating to right leg now   This is a 58 yo female who presents today with 5 days of low back pain. Pain is achy and worse in the morning and afternoon. No known injury or overuse but she does watch her 61 yo grandchildren and picks them up. Over last couple of days, has had radiation down upper part of right leg. Has felt weak in right leg but denies falls or leg giving way. No dizziness, no lightheadedness. Has taken ibuprofen 400 mg a couple of times with some relief, heat with temporary relief. Tried yoga which did not help. No change in bowel or bladder habits. Pain not interfering with sleep.    Review of Systems Per HPI    Objective:   Physical Exam Vitals reviewed.  Constitutional:      Appearance: Normal appearance. She is normal weight.  HENT:     Head: Normocephalic and atraumatic.  Eyes:     Conjunctiva/sclera: Conjunctivae normal.  Cardiovascular:     Rate and Rhythm: Normal rate and regular rhythm.     Heart sounds: Normal heart sounds.  Pulmonary:     Effort: Pulmonary effort is normal.     Breath sounds: Normal breath sounds.  Musculoskeletal:     Cervical back: Normal, normal range of motion and neck supple.     Thoracic back: Normal.     Lumbar back: Tenderness (right paraspinal) present. No bony tenderness. Normal range of motion. Negative right straight leg raise test and negative left straight leg raise test.  Skin:    General: Skin is warm and dry.  Neurological:     Mental Status: She is alert and oriented to person, place, and time.     Motor: No weakness.     Gait: Gait normal.     Deep Tendon Reflexes: Reflexes normal.  Psychiatric:        Mood and Affect: Mood normal.        Behavior: Behavior normal.        Thought Content: Thought content normal.         Judgment: Judgment normal.      BP 124/70   Pulse 65   Temp 97.8 F (36.6 C)   Wt 125 lb 8 oz (56.9 kg)   SpO2 98%   BMI 22.77 kg/m  Wt Readings from Last 3 Encounters:  11/24/19 125 lb 8 oz (56.9 kg)  09/11/19 120 lb 6 oz (54.6 kg)  10/18/18 118 lb (53.5 kg)        Assessment & Plan:  1. Strain of lumbar region, initial encounter - Provided written and verbal information regarding diagnosis and treatment. - RTC/ER precautions reviewed - discussed regular otc NSAID for next 24-48 hours, heat, gentle ROM, back exercises, activity as tolerated, avoid triggering activities   This visit occurred during the SARS-CoV-2 public health emergency.  Safety protocols were in place, including screening questions prior to the visit, additional usage of staff PPE, and extensive cleaning of exam room while observing appropriate contact time as indicated for disinfecting solutions.     Olean Ree, FNP-BC   Primary Care at [facility], [facility]  11/25/2019 11:52 AM

## 2020-01-12 ENCOUNTER — Other Ambulatory Visit: Payer: Self-pay | Admitting: Family Medicine

## 2020-01-12 ENCOUNTER — Ambulatory Visit (INDEPENDENT_AMBULATORY_CARE_PROVIDER_SITE_OTHER)
Admission: RE | Admit: 2020-01-12 | Discharge: 2020-01-12 | Disposition: A | Discharge status: Home / Self Care | Payer: BC Managed Care – PPO | Source: Ambulatory Visit | Attending: Family Medicine | Admitting: Family Medicine

## 2020-01-12 ENCOUNTER — Other Ambulatory Visit: Payer: Self-pay

## 2020-01-12 ENCOUNTER — Ambulatory Visit: Payer: BC Managed Care – PPO | Admitting: Family Medicine

## 2020-01-12 ENCOUNTER — Encounter: Payer: Self-pay | Admitting: Family Medicine

## 2020-01-12 ENCOUNTER — Ambulatory Visit
Admission: RE | Admit: 2020-01-12 | Discharge: 2020-01-12 | Disposition: A | Discharge status: Home / Self Care | Payer: BC Managed Care – PPO | Source: Ambulatory Visit | Attending: Family Medicine | Admitting: Family Medicine

## 2020-01-12 DIAGNOSIS — M79641 Pain in right hand: Secondary | ICD-10-CM

## 2020-01-12 DIAGNOSIS — M79642 Pain in left hand: Secondary | ICD-10-CM

## 2020-01-12 NOTE — Progress Notes (Signed)
Subjective:    Patient ID: Ruth Gill, female    DOB: 1961-01-06, 59 y.o.   MRN: [id number]  This visit occurred during the SARS-CoV-2 public health emergency.  Safety protocols were in place, including screening questions prior to the visit, additional usage of staff PPE, and extensive cleaning of exam room while observing appropriate contact time as indicated for disinfecting solutions.    HPI Pt presents with c/o hand pain   Wt Readings from Last 3 Encounters:  01/12/20 125 lb 9 oz (57 kg)  11/24/19 125 lb 8 oz (56.9 kg)  09/11/19 120 lb 6 oz (54.6 kg)   22.78 kg/m   Pain in thumbs  Worse on L   Worse in past 2-3 weeks   Radiates around base of thumb and rad to palm of hands   No swelling  No enlargement of hands or fingers  No new activity or reped motion Has put out mulch and painted recently- this pre dated  No loss of sens or strength   Worse with activity but sometimes L hand will throb when resting    Ibuprofen helps some  Tylenol did not help  Copper gloves did not help   Hot water helps   She has to lift grandchildren  Lifting anything (groceries) hurts  Hard to screw/unscrew lids  Has had to grip steering wheel differently   Does not write or type a lot  Does not text much   No pain in other joints   Patient Active Problem List   Diagnosis Date Noted  . Bilateral hand pain 01/12/2020  . Baker's cyst of knee, right 05/12/2017  . Hypothyroid 04/13/2014  . Colon cancer screening 01/12/2014  . Routine general medical examination at a health care facility 01/04/2014  . ALLERGIC RHINITIS 12/22/2007   Past Medical History:  Diagnosis Date  . Allergic rhinitis, cause unspecified   . Allergy   . Anemia, unspecified   . Heart murmur    MVP  . Mastodynia   . Thyroid disease    hypothyroid   Past Surgical History:  Procedure Laterality Date  . ABDOMINAL HYSTERECTOMY    . MOUTH SURGERY     Social History   Tobacco Use  . Smoking  status: Never Smoker  . Smokeless tobacco: Never Used  Substance Use Topics  . Alcohol use: No    Alcohol/week: 0.0 standard drinks  . Drug use: No   Family History  Problem Relation Age of Onset  . Colon cancer Neg Hx   . Colon polyps Neg Hx   . Esophageal cancer Neg Hx   . Rectal cancer Neg Hx   . Stomach cancer Neg Hx    Allergies  Allergen Reactions  . Azithromycin     REACTION: joint swelling, stiffness  . Penicillins     REACTION: SOB, rash   Current Outpatient Medications on File Prior to Visit  Medication Sig Dispense Refill  . Biotin 5000 MCG CAPS Take 1 capsule by mouth daily.    . Cholecalciferol (VITAMIN D) 2000 UNITS tablet Take 4,000 Units by mouth daily.     Marland Kitchen estradiol (ESTRACE) 1 MG tablet Take 1 tablet by mouth daily.     . fexofenadine (ALLEGRA) 180 MG tablet Take 180 mg by mouth daily as needed.      . fluticasone (FLONASE) 50 MCG/ACT nasal spray Place 2 sprays into both nostrils daily as needed. 16 g 11  . levothyroxine (SYNTHROID) 75 MCG tablet TAKE 1 TABLET (75  MCG TOTAL) BY MOUTH DAILY. 90 tablet 3  . meclizine (ANTIVERT) 12.5 MG tablet Take 1 tablet (12.5 mg total) by mouth 3 (three) times daily as needed for dizziness. 15 tablet 0  . Multiple Vitamins-Minerals (WOMENS MULTI GUMMIES) CHEW Chew 1 tablet by mouth daily.    . Omega-3 Fatty Acids (FISH OIL) 1200 MG CAPS Take 1 capsule by mouth daily.     No current facility-administered medications on file prior to visit.    Review of Systems  Constitutional: Negative for activity change, appetite change, fatigue, fever and unexpected weight change.  HENT: Negative for congestion, ear pain, rhinorrhea, sinus pressure and sore throat.   Eyes: Negative for pain, redness and visual disturbance.  Respiratory: Negative for cough, shortness of breath and wheezing.   Cardiovascular: Negative for chest pain and palpitations.  Gastrointestinal: Negative for abdominal pain, blood in stool, constipation and  diarrhea.  Endocrine: Negative for polydipsia and polyuria.  Genitourinary: Negative for dysuria, frequency and urgency.  Musculoskeletal: Positive for arthralgias. Negative for back pain and myalgias.       Pain at base of hand at thumb  Skin: Negative for pallor and rash.  Allergic/Immunologic: Negative for environmental allergies.  Neurological: Negative for dizziness, syncope and headaches.  Hematological: Negative for adenopathy. Does not bruise/bleed easily.  Psychiatric/Behavioral: Negative for decreased concentration and dysphoric mood. The patient is not nervous/anxious.        Objective:   Physical Exam Constitutional:      General: She is not in acute distress.    Appearance: Normal appearance. She is normal weight. She is not ill-appearing.  HENT:     Head: Normocephalic and atraumatic.  Eyes:     General: No scleral icterus.    Conjunctiva/sclera: Conjunctivae normal.     Pupils: Pupils are equal, round, and reactive to light.  Cardiovascular:     Rate and Rhythm: Normal rate.     Pulses: Normal pulses.  Pulmonary:     Effort: Pulmonary effort is normal. No respiratory distress.  Musculoskeletal:     Right hand: Tenderness and bony tenderness present. No swelling or deformity. Normal range of motion. Normal strength. Normal sensation. Normal capillary refill. Normal pulse.     Left hand: Tenderness and bony tenderness present. No swelling or deformity. Normal strength. Normal sensation. Normal capillary refill. Normal pulse.     Cervical back: Normal range of motion and neck supple. No rigidity.     Comments: Tenderness in both hands over CMC joint  Nl rom of all joints No deformity noted (baseline slight flex of L 4th distal phalange from old injury) Nl perf/sens No crepitus No triggering  Neg Finkelstein test   Skin:    General: Skin is warm and dry.     Coloration: Skin is not pale.     Findings: No erythema or rash.  Neurological:     Mental Status: She  is alert.           Assessment & Plan:   Problem List Items Addressed This Visit      Other   Bilateral hand pain    Pain primarily over CMC  joints w/o swelling or stiffness  Slight tenderness today (had taken ibuprofen)  Improved with heat and nsaid  Nl exam neurovascularly  No skin changes No other joint pain   xrays now- r/o deg change inst to avoid activities that flare it /positions  Ibuprofen prn/ heat prn

## 2020-01-12 NOTE — Patient Instructions (Signed)
Xrays of both hands today  We will contact you when results return   Use heat when needed  Adjust activities if needed  Ibuprofen is ok as directed , take with food as needed   If any swelling or redness let us know  If you develop other joint pain or rashes or other symptoms let us know

## 2020-01-12 NOTE — Assessment & Plan Note (Addendum)
Pain primarily over [facility]  joints w/o swelling or stiffness  Slight tenderness today (had taken ibuprofen)  Improved with heat and nsaid  Nl exam neurovascularly  No skin changes No other joint pain   xrays now- r/o deg change inst to avoid activities that flare it /positions  Ibuprofen prn/ heat prn

## 2020-01-15 ENCOUNTER — Telehealth: Payer: Self-pay | Admitting: *Deleted

## 2020-01-15 ENCOUNTER — Telehealth: Payer: Self-pay | Admitting: Family Medicine

## 2020-01-15 DIAGNOSIS — M79641 Pain in right hand: Secondary | ICD-10-CM

## 2020-01-15 DIAGNOSIS — M79642 Pain in left hand: Secondary | ICD-10-CM

## 2020-01-15 NOTE — Telephone Encounter (Signed)
-----   Message from Shon Millet, [place] sent at 01/15/2020 12:57 PM EDT ----- Pt viewed results on mychart. She agrees with Ortho referral. Pt said [place] is closer but she is okay to go to [place] also, she said she will go to whoever can see her 1st due to her hand pain. I advised pt Dr. Milinda Antis will put referral in and our [facility] will call to schedule appt

## 2020-01-15 NOTE — Telephone Encounter (Signed)
Left VM requesting pt to call the office back regarding hand xrays

## 2020-01-15 NOTE — Telephone Encounter (Signed)
Addressed through result notes  

## 2020-01-15 NOTE — Telephone Encounter (Signed)
Referral done Will route to [facility]  

## 2020-01-22 DIAGNOSIS — M182 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: Secondary | ICD-10-CM | POA: Insufficient documentation

## 2020-09-03 ENCOUNTER — Telehealth: Payer: Self-pay | Admitting: Family Medicine

## 2020-09-03 DIAGNOSIS — E039 Hypothyroidism, unspecified: Secondary | ICD-10-CM

## 2020-09-03 DIAGNOSIS — Z0000 Encounter for general adult medical examination without abnormal findings: Secondary | ICD-10-CM

## 2020-09-03 NOTE — Telephone Encounter (Signed)
-----   Message from Aquilla Solian, RT sent at 08/26/2020 10:06 AM EST ----- Regarding: Lab Orders for Wednesday 12.8.2021 Please place lab orders for Wednesday 12.8.2021, office visit for physical on Wednesday 12.15.2021 Thank you, Jones Bales RT(R)

## 2020-09-04 ENCOUNTER — Other Ambulatory Visit (INDEPENDENT_AMBULATORY_CARE_PROVIDER_SITE_OTHER): Payer: BC Managed Care – PPO

## 2020-09-04 ENCOUNTER — Other Ambulatory Visit: Payer: Self-pay

## 2020-09-04 DIAGNOSIS — Z0000 Encounter for general adult medical examination without abnormal findings: Secondary | ICD-10-CM | POA: Diagnosis not present

## 2020-09-04 DIAGNOSIS — E039 Hypothyroidism, unspecified: Secondary | ICD-10-CM | POA: Diagnosis not present

## 2020-09-04 LAB — CBC WITH DIFFERENTIAL/PLATELET
Basophils Absolute: 0 10*3/uL (ref 0.0–0.1)
Basophils Relative: 0.7 % (ref 0.0–3.0)
Eosinophils Absolute: 0.2 10*3/uL (ref 0.0–0.7)
Eosinophils Relative: 2.7 % (ref 0.0–5.0)
HCT: 36.6 % (ref 36.0–46.0)
Hemoglobin: 12.5 g/dL (ref 12.0–15.0)
Lymphocytes Relative: 34.8 % (ref 12.0–46.0)
Lymphs Abs: 1.9 10*3/uL (ref 0.7–4.0)
MCHC: 34 g/dL (ref 30.0–36.0)
MCV: 93.3 fl (ref 78.0–100.0)
Monocytes Absolute: 0.4 10*3/uL (ref 0.1–1.0)
Monocytes Relative: 7.9 % (ref 3.0–12.0)
Neutro Abs: 3 10*3/uL (ref 1.4–7.7)
Neutrophils Relative %: 53.9 % (ref 43.0–77.0)
Platelets: 172 10*3/uL (ref 150.0–400.0)
RBC: 3.92 Mil/uL (ref 3.87–5.11)
RDW: 12.5 % (ref 11.5–15.5)
WBC: 5.5 10*3/uL (ref 4.0–10.5)

## 2020-09-04 LAB — COMPREHENSIVE METABOLIC PANEL
ALT: 16 U/L (ref 0–35)
AST: 17 U/L (ref 0–37)
Albumin: 4.2 g/dL (ref 3.5–5.2)
Alkaline Phosphatase: 41 U/L (ref 39–117)
BUN: 21 mg/dL (ref 6–23)
CO2: 31 mEq/L (ref 19–32)
Calcium: 9.1 mg/dL (ref 8.4–10.5)
Chloride: 103 mEq/L (ref 96–112)
Creatinine, Ser: 0.81 mg/dL (ref 0.40–1.20)
GFR: 78.56 mL/min (ref >60.00)
Glucose, Bld: 80 mg/dL (ref 70–99)
Potassium: 4.1 mEq/L (ref 3.5–5.1)
Sodium: 139 mEq/L (ref 135–145)
Total Bilirubin: 0.5 mg/dL (ref 0.2–1.2)
Total Protein: 6.7 g/dL (ref 6.0–8.3)

## 2020-09-04 LAB — LIPID PANEL
Cholesterol: 186 mg/dL (ref 0–200)
HDL: 62.7 mg/dL (ref >39.00)
LDL Cholesterol: 105 mg/dL — ABNORMAL HIGH (ref 0–99)
NonHDL: 123.57
Total CHOL/HDL Ratio: 3
Triglycerides: 91 mg/dL (ref 0.0–149.0)
VLDL: 18.2 mg/dL (ref 0.0–40.0)

## 2020-09-04 LAB — TSH: TSH: 5.3 u[IU]/mL — ABNORMAL HIGH (ref 0.35–4.50)

## 2020-09-11 ENCOUNTER — Other Ambulatory Visit: Payer: Self-pay

## 2020-09-11 ENCOUNTER — Ambulatory Visit (INDEPENDENT_AMBULATORY_CARE_PROVIDER_SITE_OTHER): Payer: BC Managed Care – PPO | Admitting: Family Medicine

## 2020-09-11 ENCOUNTER — Encounter: Payer: Self-pay | Admitting: Family Medicine

## 2020-09-11 VITALS — BP 116/70 | HR 59 | Temp 96.9°F | Ht 62.75 in | Wt 128.1 lb

## 2020-09-11 DIAGNOSIS — Z23 Encounter for immunization: Secondary | ICD-10-CM | POA: Diagnosis not present

## 2020-09-11 DIAGNOSIS — E039 Hypothyroidism, unspecified: Secondary | ICD-10-CM

## 2020-09-11 DIAGNOSIS — Z0000 Encounter for general adult medical examination without abnormal findings: Secondary | ICD-10-CM

## 2020-09-11 MED ORDER — LEVOTHYROXINE SODIUM 88 MCG PO TABS: 88.0000 ug | ORAL_TABLET | Freq: Every day | ORAL | 3 refills | Status: DC

## 2020-09-11 NOTE — Assessment & Plan Note (Signed)
Lab Results  Component Value Date   TSH 5.30 (H) 09/04/2020   No clinical change  Taking med correctly  Will inc levothyroxine to 88 mcg daily and re check TSH in 6 wk

## 2020-09-11 NOTE — Patient Instructions (Addendum)
Go up on your levothyroxine to 88 mcg daily  Let's re check TSH in about 6 weeks   Keep exercising  Take care of yourself   Try to get 1200-1500 mg of calcium per day with at least 1000 iu of vitamin D - for bone health   Flu shot today

## 2020-09-11 NOTE — Progress Notes (Signed)
Subjective:    Patient ID: Ruth Gill, female    DOB: Feb 08, 1961, 59 y.o.   MRN: [id number]  This visit occurred during the SARS-CoV-2 public health emergency.  Safety protocols were in place, including screening questions prior to the visit, additional usage of staff PPE, and extensive cleaning of exam room while observing appropriate contact time as indicated for disinfecting solutions.    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 - sees Dr Sabra Heck and had a cortisone shot  Wears braces at night  Takes some diclofenac   Flu shot -today Tdap 4/15 Zoster status -shingrix 10/21  covid status-pfizer imm Planning booster   ifob kit 11/16, colonoscopy 1/20  BP Readings from Last 3 Encounters:  09/11/20 116/70  01/12/20 132/80  11/24/19 124/70   Pulse Readings from Last 3 Encounters:  09/11/20 (!) 59  01/12/20 76  11/24/19 65   Hypothyroidism  Pt has no clinical changes No change in energy level/ hair or skin/ edema and no tremor Lab Results  Component Value Date   TSH 5.30 (H) 09/04/2020    Takes levothyroxine 75 mcg daily  No missed doses  She waits 30 minutes before food or other meds   Cholesterol Lab Results  Component Value Date   CHOL 186 09/04/2020   CHOL 186 09/05/2019   CHOL 188 08/31/2018   Lab Results  Component Value Date   HDL 62.70 09/04/2020   HDL 62.40 09/05/2019   HDL 61.90 08/31/2018   Lab Results  Component Value Date   LDLCALC 105 (H) 09/04/2020   LDLCALC 110 (H) 09/05/2019   LDLCALC 109 (H) 08/31/2018   Lab  Results  Component Value Date   TRIG 91.0 09/04/2020   TRIG 68.0 09/05/2019   TRIG 83.0 08/31/2018   Lab Results  Component Value Date   CHOLHDL 3 09/04/2020   CHOLHDL 3 09/05/2019   CHOLHDL 3 08/31/2018   No results found for: LDLDIRECT  Family history - PGF heart trouble   Eats healthy   Other labs Lab Results  Component Value Date   WBC 5.5 09/04/2020   HGB 12.5 09/04/2020   HCT 36.6 09/04/2020   MCV 93.3 09/04/2020   PLT 172.0 09/04/2020   Lab Results  Component Value Date   CREATININE 0.81 09/04/2020   BUN 21 09/04/2020   NA 139 09/04/2020   K 4.1 09/04/2020   CL 103 09/04/2020   CO2 31 09/04/2020   Lab Results  Component Value Date   ALT 16 09/04/2020   AST 17 09/04/2020   ALKPHOS 41 09/04/2020   BILITOT 0.5 09/04/2020    Patient Active Problem List   Diagnosis Date Noted  . Bilateral hand pain 01/12/2020  . Baker's cyst of knee, right 05/12/2017  . Hypothyroid 04/13/2014  . Colon cancer screening 01/12/2014  . Routine general medical examination at a health care facility 01/04/2014  . ALLERGIC RHINITIS 12/22/2007   Past Medical History:  Diagnosis Date  . Allergic rhinitis,  cause unspecified   . Allergy   . Anemia, unspecified   . Heart murmur    MVP  . Mastodynia   . Thyroid disease    hypothyroid   Past Surgical History:  Procedure Laterality Date  . ABDOMINAL HYSTERECTOMY    . MOUTH SURGERY     Social History   Tobacco Use  . Smoking status: Never Smoker  . Smokeless tobacco: Never Used  Substance Use Topics  . Alcohol use: No    Alcohol/week: 0.0 standard drinks  . Drug use: No   Family History  Problem Relation Age of Onset  . Colon cancer Neg Hx   . Colon polyps Neg Hx   . Esophageal cancer Neg Hx   . Rectal cancer Neg Hx   . Stomach cancer Neg Hx    Allergies  Allergen Reactions  . Azithromycin     REACTION: joint swelling, stiffness  . Mobic [Meloxicam] Hives    Looks like sun burn  . Penicillins     REACTION:  SOB, rash   Current Outpatient Medications on File Prior to Visit  Medication Sig Dispense Refill  . Biotin 5000 MCG CAPS Take 1 capsule by mouth daily.    . Cholecalciferol (VITAMIN D) 2000 UNITS tablet Take 4,000 Units by mouth daily.     . diclofenac (VOLTAREN) 75 MG EC tablet Take 1 tablet by mouth 2 (two) times daily.    Marland Kitchen estradiol (ESTRACE) 1 MG tablet Take 1 tablet by mouth daily.    . fexofenadine (ALLEGRA) 180 MG tablet Take 180 mg by mouth daily as needed.    . fluticasone (FLONASE) 50 MCG/ACT nasal spray Place 2 sprays into both nostrils daily as needed. 16 g 11  . meclizine (ANTIVERT) 12.5 MG tablet Take 1 tablet (12.5 mg total) by mouth 3 (three) times daily as needed for dizziness. 15 tablet 0  . Multiple Vitamins-Minerals (WOMENS MULTI GUMMIES) CHEW Chew 1 tablet by mouth daily.    . Omega-3 Fatty Acids (FISH OIL) 1200 MG CAPS Take 1 capsule by mouth daily.     No current facility-administered medications on file prior to visit.    Review of Systems  Constitutional: Negative for activity change, appetite change, fatigue, fever and unexpected weight change.  HENT: Negative for congestion, ear pain, rhinorrhea, sinus pressure and sore throat.   Eyes: Negative for pain, redness and visual disturbance.  Respiratory: Negative for cough, shortness of breath and wheezing.   Cardiovascular: Negative for chest pain and palpitations.  Gastrointestinal: Negative for abdominal pain, blood in stool, constipation and diarrhea.  Endocrine: Negative for polydipsia and polyuria.  Genitourinary: Negative for dysuria, frequency and urgency.  Musculoskeletal: Positive for arthralgias. Negative for back pain and myalgias.       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  Allergic/Immunologic: Negative for environmental allergies.  Neurological: Negative for dizziness, syncope and headaches.  Hematological: Negative for adenopathy. Does not bruise/bleed easily.   Psychiatric/Behavioral: Negative for decreased concentration and dysphoric mood. The patient is not nervous/anxious.        Objective:   Physical Exam Constitutional:      General: She is not in acute distress.    Appearance: Normal appearance. She is well-developed and normal weight. She is not ill-appearing or diaphoretic.  HENT:     Head: Normocephalic and atraumatic.     Right Ear: Tympanic membrane, ear canal and external ear normal.     Left Ear: Tympanic membrane, ear canal and  external ear normal.     Nose: Nose normal. No congestion.     Mouth/Throat:     Mouth: Mucous membranes are moist.     Pharynx: Oropharynx is clear. No posterior oropharyngeal erythema.  Eyes:     General: No scleral icterus.    Extraocular Movements: Extraocular movements intact.     Conjunctiva/sclera: Conjunctivae normal.     Pupils: Pupils are equal, round, and reactive to light.  Neck:     Thyroid: No thyromegaly.     Vascular: No carotid bruit or JVD.  Cardiovascular:     Rate and Rhythm: Normal rate and regular rhythm.     Pulses: Normal pulses.     Heart sounds: Normal heart sounds. No gallop.   Pulmonary:     Effort: Pulmonary effort is normal. No respiratory distress.     Breath sounds: Normal breath sounds. No wheezing.     Comments: Good air exch Chest:     Chest wall: No tenderness.  Abdominal:     General: Bowel sounds are normal. There is no distension or abdominal bruit.     Palpations: Abdomen is soft. There is no mass.     Tenderness: There is no abdominal tenderness.     Hernia: No hernia is present.  Genitourinary:    Comments: Gyn provider does breast and pelvic exam  Musculoskeletal:        General: No tenderness. Normal range of motion.     Cervical back: Normal range of motion and neck supple. No rigidity. No muscular tenderness.     Right lower leg: No edema.     Left lower leg: No edema.  Lymphadenopathy:     Cervical: No cervical adenopathy.  Skin:     General: Skin is warm and dry.     Coloration: Skin is not pale.     Findings: No erythema or rash.     Comments: Solar lentigines diffusely   Neurological:     Mental Status: She is alert. Mental status is at baseline.     Cranial Nerves: No cranial nerve deficit.     Motor: No abnormal muscle tone.     Coordination: Coordination normal.     Gait: Gait normal.     Deep Tendon Reflexes: Reflexes are normal and symmetric. Reflexes normal.  Psychiatric:        Mood and Affect: Mood normal.        Cognition and Memory: Cognition and memory normal.           Assessment & Plan:   Problem List Items Addressed This Visit      Endocrine   Hypothyroid    Lab Results  Component Value Date   TSH 5.30 (H) 09/04/2020   No clinical change  Taking med correctly  Will inc levothyroxine to 88 mcg daily and re check TSH in 6 wk      Relevant Medications   levothyroxine (SYNTHROID) 88 MCG tablet     Other   Routine general medical examination at a health care facility - Primary    Reviewed health habits including diet and exercise and skin cancer prevention Reviewed appropriate screening tests for age  Also reviewed health mt list, fam hx and immunization status , as well as social and family history   See HPI Labs rev Flu shot given Planning covid booster and then later shingrix 2nd shot  Good exercise and diet habits Stable labs  Sent for last mammogram report 2/20 at gyn office  Continues estrace from gyn

## 2020-09-11 NOTE — Assessment & Plan Note (Signed)
Reviewed health habits including diet and exercise and skin cancer prevention Reviewed appropriate screening tests for age  Also reviewed health mt list, fam hx and immunization status , as well as social and family history   See HPI Labs rev Flu shot given Planning covid booster and then later shingrix 2nd shot  Good exercise and diet habits Stable labs  Sent for last mammogram report 2/20 at gyn office Continues estrace from gyn

## 2020-10-13 ENCOUNTER — Other Ambulatory Visit: Payer: Self-pay | Admitting: Family Medicine

## 2020-10-23 ENCOUNTER — Other Ambulatory Visit: Payer: Self-pay

## 2020-10-23 ENCOUNTER — Telehealth (INDEPENDENT_AMBULATORY_CARE_PROVIDER_SITE_OTHER): Payer: BC Managed Care – PPO | Admitting: Family Medicine

## 2020-10-23 ENCOUNTER — Other Ambulatory Visit (INDEPENDENT_AMBULATORY_CARE_PROVIDER_SITE_OTHER): Payer: BC Managed Care – PPO

## 2020-10-23 DIAGNOSIS — E039 Hypothyroidism, unspecified: Secondary | ICD-10-CM | POA: Diagnosis not present

## 2020-10-23 LAB — TSH: TSH: 3.04 u[IU]/mL (ref 0.35–4.50)

## 2020-10-23 NOTE — Telephone Encounter (Signed)
-----   Message from Aquilla Solian, RT sent at 10/07/2020  3:19 PM EST ----- Regarding: Lab Orders for Wednesday 1.26.2022 Please place lab orders for Wednesday 1.26.2022, appt notes state "6 wk thyroid labs" Thank you, Jones Bales RT(R)

## 2020-12-02 ENCOUNTER — Other Ambulatory Visit: Payer: Self-pay | Admitting: Family Medicine

## 2020-12-05 ENCOUNTER — Other Ambulatory Visit: Payer: Self-pay | Admitting: Family Medicine

## 2020-12-23 IMAGING — DX DG HAND COMPLETE 3+V*R*
3 series · 3 of 3 positions shown · non-contrast
Comparison: None.

CLINICAL DATA: Bilateral hand pain, left greater than right, worse
at base of thumb

EXAM:
LEFT HAND - COMPLETE 3+ VIEW; RIGHT HAND - COMPLETE 3+ VIEW

[hand ap]
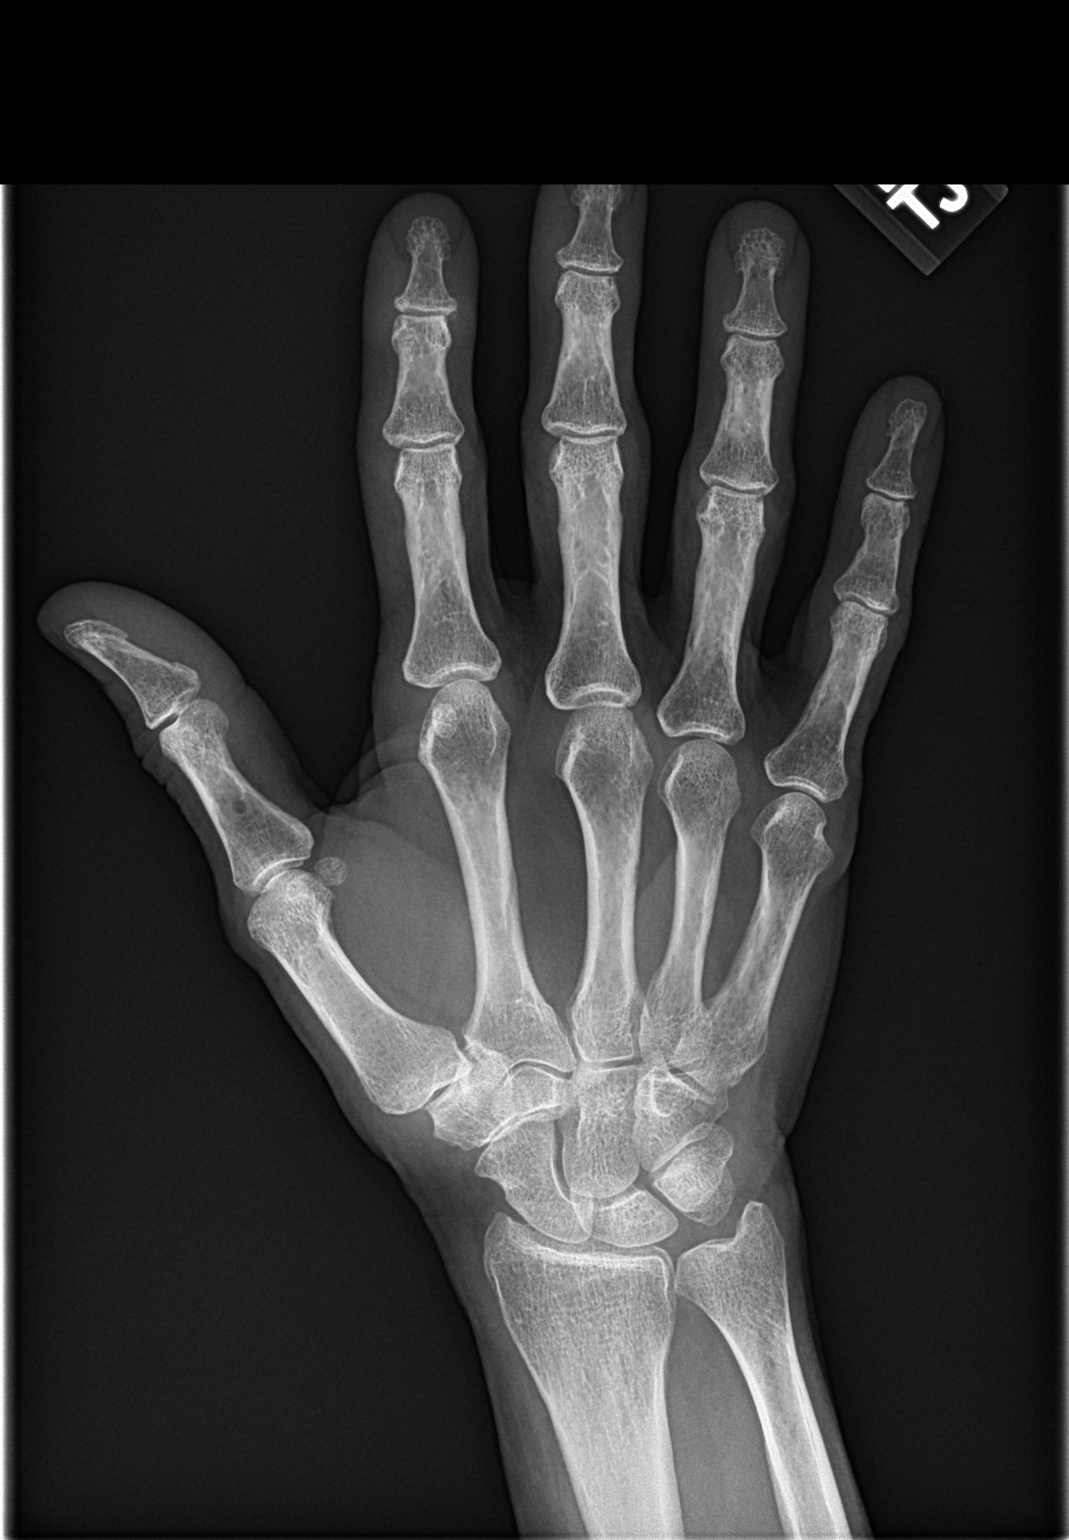

[hand obl]
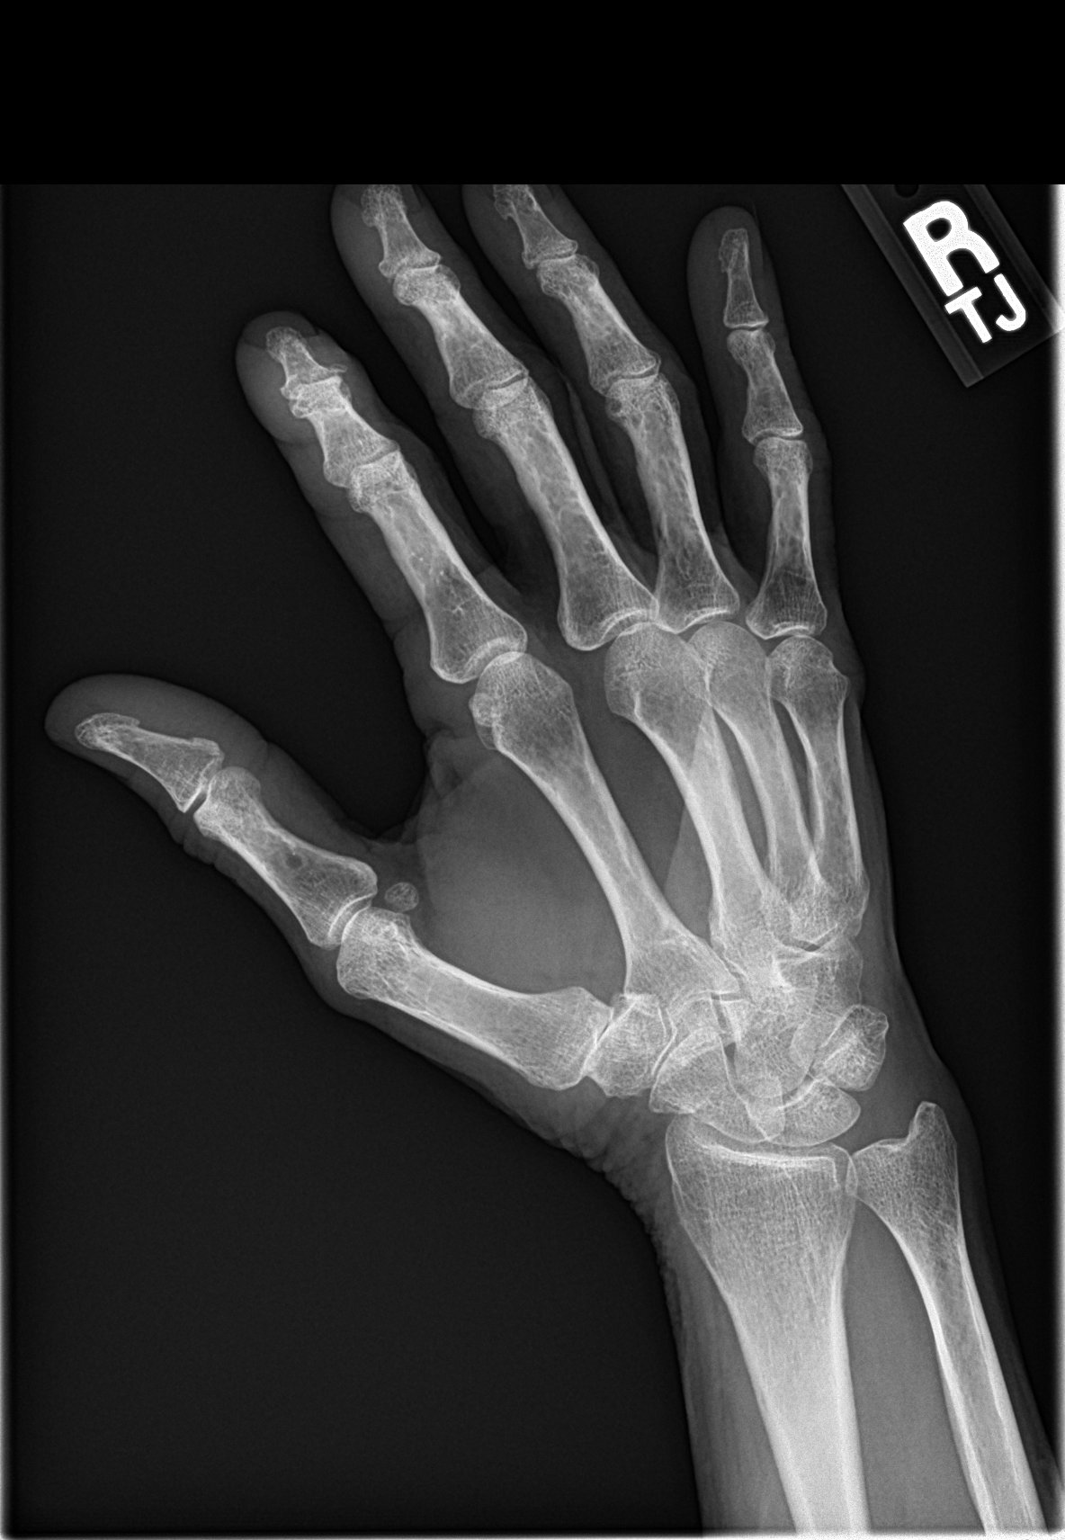

[hand lat]
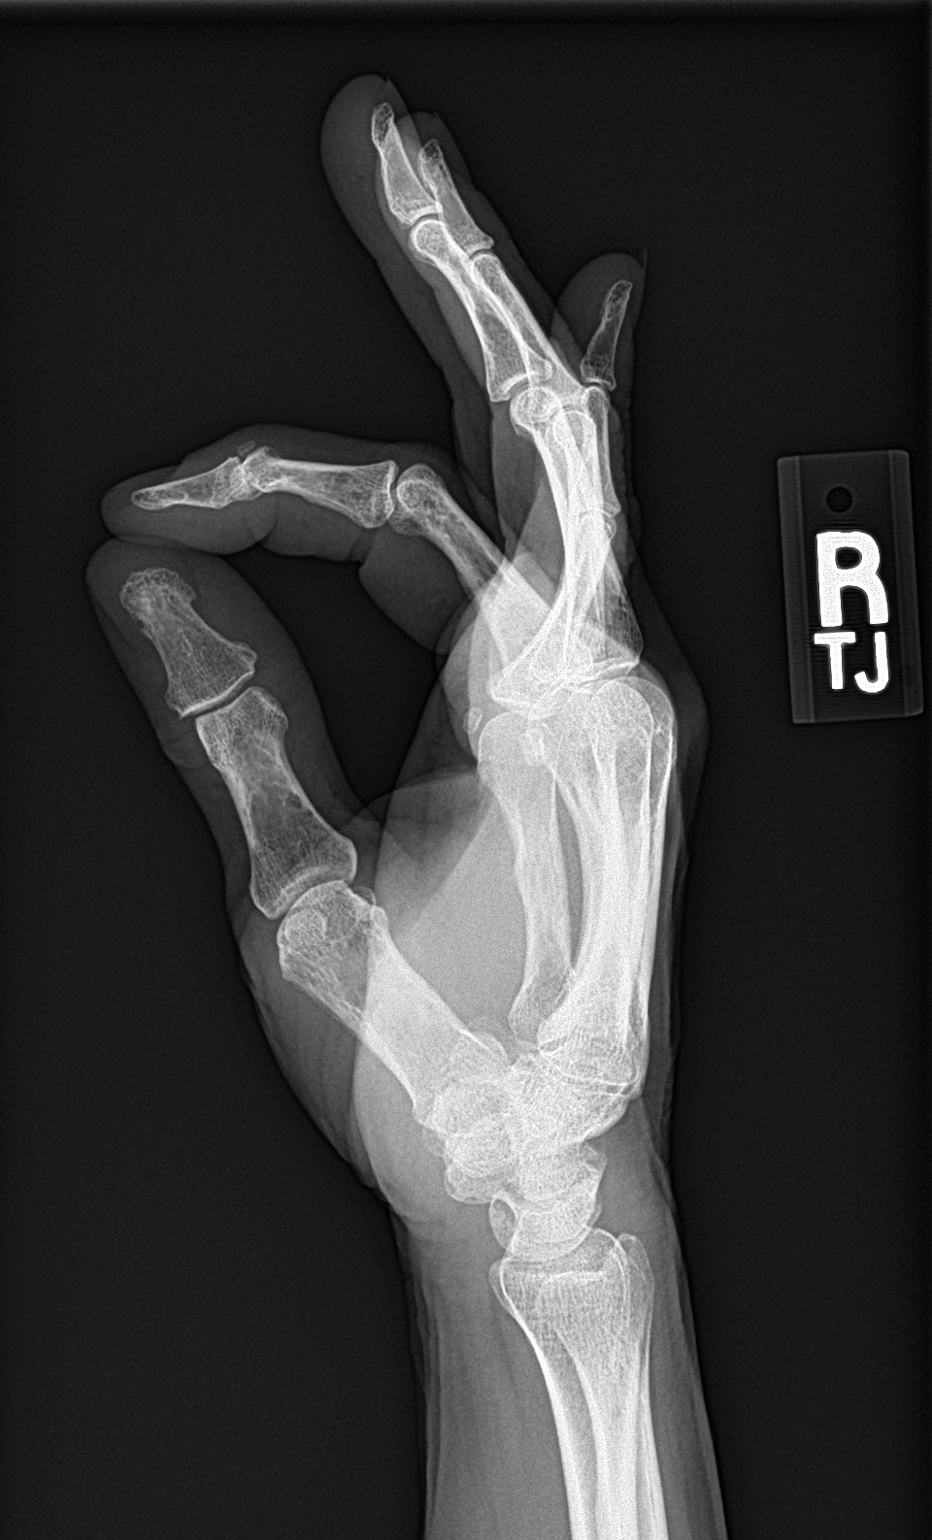

[3 of 3 positions shown; findings below may reference images not displayed]

FINDINGS: Left hand: Frontal, oblique, lateral views of the left hand are
obtained. There is mild joint space narrowing and osteophyte
formation at the scaphoid trapezial and first carpometacarpal joints
consistent with osteoarthritis. Mild diffuse interphalangeal joint
space narrowing most pronounced distally at the second and fourth
digits. No marginal erosions. Alignment is anatomic. No fractures.

Right hand: Frontal, oblique, lateral views are obtained. There is
mild joint space narrowing and osteophyte formation at the scaphoid
trapezial and first carpometacarpal joints. Mild interphalangeal
joint space narrowing most pronounced at the second distal
interphalangeal joint. Alignment is anatomic. No erosive changes. No
fractures.
IMPRESSION: 1. Osteoarthritis within the radial aspect of the wrist and first
carpometacarpal joints bilaterally. Slightly more prominent on the
left.
2. Distal interphalangeal joint osteoarthritis most pronounced in
the left second and fourth digits and right second digit.
3. No erosive changes.

## 2021-04-07 ENCOUNTER — Encounter: Payer: Self-pay | Admitting: Family Medicine

## 2021-04-15 ENCOUNTER — Other Ambulatory Visit: Payer: Self-pay

## 2021-04-15 ENCOUNTER — Ambulatory Visit: Payer: BC Managed Care – PPO | Admitting: Family Medicine

## 2021-04-15 ENCOUNTER — Encounter: Payer: Self-pay | Admitting: Family Medicine

## 2021-04-15 VITALS — BP 118/66 | HR 64 | Temp 97.3°F | Ht 62.75 in | Wt 127.4 lb

## 2021-04-15 DIAGNOSIS — R5383 Other fatigue: Secondary | ICD-10-CM | POA: Insufficient documentation

## 2021-04-15 DIAGNOSIS — R5382 Chronic fatigue, unspecified: Secondary | ICD-10-CM

## 2021-04-15 DIAGNOSIS — E039 Hypothyroidism, unspecified: Secondary | ICD-10-CM

## 2021-04-15 NOTE — Assessment & Plan Note (Signed)
More fatigue lately  TSH ordered  Taking 88 mcg levothyroxine daily  Taking it correctly

## 2021-04-15 NOTE — Patient Instructions (Addendum)
Work towards 64 oz of fluid daily (mostly water)   Labs today  Keep exercising and eating healthy   Will update you with results

## 2021-04-15 NOTE — Progress Notes (Signed)
Subjective:    Patient ID: Ruth Gill, female    DOB: 1961/04/22, 60 y.o.   MRN: [id number]  This visit occurred during the SARS-CoV-2 public health emergency.  Safety protocols were in place, including screening questions prior to the visit, additional usage of staff PPE, and extensive cleaning of exam room while observing appropriate contact time as indicated for disinfecting solutions.   HPI Pt presents with some fatigue and thyroid concerns  Wt Readings from Last 3 Encounters:  04/15/21 127 lb 6 oz (57.8 kg)  09/11/20 128 lb 2 oz (58.1 kg)  01/12/20 125 lb 9 oz (57 kg)   22.74 kg/m  Very fatigued last 3-4 weeks  No change in diet or exercise  Weight watchers/lifetime - eats frequently and tracks things and eats 3 meals per day with protein and veggies  No illness / just some allergies   Meds-no changes   No h/o low B12   Sleep - fine  Gets up once to urinate  Goes to bed around 11 and gets up 7-8 am  No snoring as a rule  Wakes up refreshed   Coffee- 2 in am  40 oz of water daily max/needs to drink more   Occ a little light headed /off balance  No headaches as a rule   BP Readings from Last 3 Encounters:  04/15/21 118/66  09/11/20 116/70  01/12/20 132/80   Had a tick bite 2 weeks ago -no rash/ no mark at the site  One in April on leg  No symptoms after that   No fever No rash No joint pain that is new  No recent covid that she knows of   Hypothyroid Lab Results  Component Value Date   TSH 3.04 10/23/2020    Takes levothyroxine 88 mcg daily   Takes estrace 1 mg daily   Lab Results  Component Value Date   WBC 5.5 09/04/2020   HGB 12.5 09/04/2020   HCT 36.6 09/04/2020   MCV 93.3 09/04/2020   PLT 172.0 09/04/2020   Mood has been fine-not depressed at all   Exercise - walking and beach body videos and weight lifting   Patient Active Problem List   Diagnosis Date Noted   Fatigue 04/15/2021   Bilateral hand pain 01/12/2020   Baker's  cyst of knee, right 05/12/2017   Hypothyroid 04/13/2014   Colon cancer screening 01/12/2014   Routine general medical examination at a health care facility 01/04/2014   ALLERGIC RHINITIS 12/22/2007   Past Medical History:  Diagnosis Date   Allergic rhinitis, cause unspecified    Allergy    Anemia, unspecified    Heart murmur    MVP   Mastodynia    Thyroid disease    hypothyroid   Past Surgical History:  Procedure Laterality Date   ABDOMINAL HYSTERECTOMY     MOUTH SURGERY     Social History   Tobacco Use   Smoking status: Never   Smokeless tobacco: Never  Substance Use Topics   Alcohol use: No    Alcohol/week: 0.0 standard drinks   Drug use: No   Family History  Problem Relation Age of Onset   Colon cancer Neg Hx    Colon polyps Neg Hx    Esophageal cancer Neg Hx    Rectal cancer Neg Hx    Stomach cancer Neg Hx    Allergies  Allergen Reactions   Azithromycin     REACTION: joint swelling, stiffness   Mobic [Meloxicam] Hives  Looks like sun burn   Penicillins     REACTION: SOB, rash   Current Outpatient Medications on File Prior to Visit  Medication Sig Dispense Refill   Biotin 5000 MCG CAPS Take 1 capsule by mouth daily.     Cholecalciferol (VITAMIN D) 2000 UNITS tablet Take 4,000 Units by mouth daily.      diclofenac (VOLTAREN) 75 MG EC tablet Take 1 tablet by mouth 2 (two) times daily.     estradiol (ESTRACE) 1 MG tablet Take 1 tablet by mouth daily.     fexofenadine (ALLEGRA) 180 MG tablet Take 180 mg by mouth daily as needed.     fluticasone (FLONASE) 50 MCG/ACT nasal spray Place 2 sprays into both nostrils daily as needed. 16 g 11   levothyroxine (SYNTHROID) 88 MCG tablet Take 1 tablet (88 mcg total) by mouth daily. 90 tablet 3   meclizine (ANTIVERT) 12.5 MG tablet Take 1 tablet (12.5 mg total) by mouth 3 (three) times daily as needed for dizziness. 15 tablet 0   Multiple Vitamins-Minerals (WOMENS MULTI GUMMIES) CHEW Chew 1 tablet by mouth daily.      Omega-3 Fatty Acids (FISH OIL) 1200 MG CAPS Take 1 capsule by mouth daily.     No current facility-administered medications on file prior to visit.     Review of Systems  Constitutional:  Positive for fatigue. Negative for activity change, appetite change, chills, diaphoresis, fever and unexpected weight change.  HENT:  Negative for congestion, ear pain, rhinorrhea, sinus pressure and sore throat.   Eyes:  Negative for pain, redness and visual disturbance.  Respiratory:  Negative for cough, shortness of breath and wheezing.   Cardiovascular:  Negative for chest pain and palpitations.  Gastrointestinal:  Negative for abdominal pain, blood in stool, constipation and diarrhea.  Endocrine: Negative for polydipsia and polyuria.  Genitourinary:  Negative for dysuria, frequency and urgency.  Musculoskeletal:  Negative for arthralgias, back pain and myalgias.  Skin:  Negative for pallor and rash.  Allergic/Immunologic: Negative for environmental allergies.  Neurological:  Negative for dizziness, syncope and headaches.  Hematological:  Negative for adenopathy. Does not bruise/bleed easily.  Psychiatric/Behavioral:  Negative for confusion, decreased concentration, dysphoric mood, self-injury and sleep disturbance. The patient is not nervous/anxious and is not hyperactive.       Objective:   Physical Exam Constitutional:      General: She is not in acute distress.    Appearance: Normal appearance. She is well-developed and normal weight. She is not ill-appearing or diaphoretic.  HENT:     Head: Normocephalic and atraumatic.  Eyes:     Conjunctiva/sclera: Conjunctivae normal.     Pupils: Pupils are equal, round, and reactive to light.  Neck:     Thyroid: No thyromegaly.     Vascular: No carotid bruit or JVD.     Comments: No palpable thyromegaly  Cardiovascular:     Rate and Rhythm: Normal rate and regular rhythm.     Heart sounds: Normal heart sounds.    No gallop.  Pulmonary:      Effort: Pulmonary effort is normal. No respiratory distress.     Breath sounds: Normal breath sounds. No wheezing or rales.  Abdominal:     General: Bowel sounds are normal. There is no distension or abdominal bruit.     Palpations: Abdomen is soft. There is no mass.     Tenderness: There is no abdominal tenderness.  Musculoskeletal:     Cervical back: Normal range of motion and neck  supple.     Right lower leg: No edema.     Left lower leg: No edema.  Lymphadenopathy:     Cervical: No cervical adenopathy.  Skin:    General: Skin is warm and dry.     Coloration: Skin is not jaundiced or pale.     Findings: No rash.  Neurological:     Mental Status: She is alert.     Cranial Nerves: No cranial nerve deficit.     Sensory: No sensory deficit.     Motor: No weakness.     Coordination: Coordination normal.     Deep Tendon Reflexes: Reflexes are normal and symmetric. Reflexes normal.  Psychiatric:        Attention and Perception: Attention normal.        Mood and Affect: Mood normal.        Cognition and Memory: Cognition and memory normal.     Comments: Mood is good          Assessment & Plan:   Problem List Items Addressed This Visit       Endocrine   Hypothyroid - Primary    More fatigue lately  TSH ordered  Taking 88 mcg levothyroxine daily  Taking it correctly         Relevant Orders   TSH     Other   Fatigue    3-4 weeks  No change in habits /diet/exercise and no known illness Sleep is ok and restorative  occ light headed   Some caffeine and not enough water  Denies depression (mood is good)  Remote h/o tick bite but no mark left/rash/fever/joint pain or other symptoms  Labs ordered Enc to keep up good health habits  Pend results         Relevant Orders   CBC with Differential/Platelet   Comprehensive metabolic panel   TSH   VITAMIN D 25 Hydroxy (Vit-D Deficiency, Fractures)   Iron   Vitamin B12

## 2021-04-15 NOTE — Assessment & Plan Note (Signed)
3-4 weeks  No change in habits /diet/exercise and no known illness Sleep is ok and restorative  occ light headed   Some caffeine and not enough water  Denies depression (mood is good)  Remote h/o tick bite but no mark left/rash/fever/joint pain or other symptoms  Labs ordered Enc to keep up good health habits  Pend results

## 2021-04-16 LAB — VITAMIN B12: Vitamin B-12: 379 pg/mL (ref 211–911)

## 2021-04-16 LAB — COMPREHENSIVE METABOLIC PANEL
ALT: 13 U/L (ref 0–35)
AST: 14 U/L (ref 0–37)
Albumin: 4.1 g/dL (ref 3.5–5.2)
Alkaline Phosphatase: 40 U/L (ref 39–117)
BUN: 21 mg/dL (ref 6–23)
CO2: 30 mEq/L (ref 19–32)
Calcium: 9.4 mg/dL (ref 8.4–10.5)
Chloride: 101 mEq/L (ref 96–112)
Creatinine, Ser: 0.75 mg/dL (ref 0.40–1.20)
GFR: 85.79 mL/min (ref >60.00)
Glucose, Bld: 78 mg/dL (ref 70–99)
Potassium: 4.2 mEq/L (ref 3.5–5.1)
Sodium: 140 mEq/L (ref 135–145)
Total Bilirubin: 0.3 mg/dL (ref 0.2–1.2)
Total Protein: 6.9 g/dL (ref 6.0–8.3)

## 2021-04-16 LAB — TSH: TSH: 2.4 u[IU]/mL (ref 0.35–5.50)

## 2021-04-16 LAB — CBC WITH DIFFERENTIAL/PLATELET
Basophils Absolute: 0 10*3/uL (ref 0.0–0.1)
Basophils Relative: 0.8 % (ref 0.0–3.0)
Eosinophils Absolute: 0.3 10*3/uL (ref 0.0–0.7)
Eosinophils Relative: 6 % — ABNORMAL HIGH (ref 0.0–5.0)
HCT: 35.4 % — ABNORMAL LOW (ref 36.0–46.0)
Hemoglobin: 12.2 g/dL (ref 12.0–15.0)
Lymphocytes Relative: 30.3 % (ref 12.0–46.0)
Lymphs Abs: 1.7 10*3/uL (ref 0.7–4.0)
MCHC: 34.5 g/dL (ref 30.0–36.0)
MCV: 94.5 fl (ref 78.0–100.0)
Monocytes Absolute: 0.5 10*3/uL (ref 0.1–1.0)
Monocytes Relative: 8.8 % (ref 3.0–12.0)
Neutro Abs: 3 10*3/uL (ref 1.4–7.7)
Neutrophils Relative %: 54.1 % (ref 43.0–77.0)
Platelets: 210 10*3/uL (ref 150.0–400.0)
RBC: 3.75 Mil/uL — ABNORMAL LOW (ref 3.87–5.11)
RDW: 12.1 % (ref 11.5–15.5)
WBC: 5.5 10*3/uL (ref 4.0–10.5)

## 2021-04-16 LAB — VITAMIN D 25 HYDROXY (VIT D DEFICIENCY, FRACTURES): VITD: 72.59 ng/mL (ref 30.00–100.00)

## 2021-04-16 LAB — IRON: Iron: 44 ug/dL (ref 42–145)

## 2021-04-17 ENCOUNTER — Encounter: Payer: Self-pay | Admitting: Family Medicine

## 2021-04-18 MED ORDER — POLYSACCHARIDE IRON COMPLEX 150 MG PO CAPS: 150.0000 mg | ORAL_CAPSULE | ORAL | 1 refills | Status: DC

## 2021-05-26 ENCOUNTER — Encounter: Payer: Self-pay | Admitting: Family Medicine

## 2021-05-26 DIAGNOSIS — E611 Iron deficiency: Secondary | ICD-10-CM

## 2021-05-30 ENCOUNTER — Other Ambulatory Visit: Payer: Self-pay

## 2021-05-30 ENCOUNTER — Other Ambulatory Visit (INDEPENDENT_AMBULATORY_CARE_PROVIDER_SITE_OTHER): Payer: BC Managed Care – PPO

## 2021-05-30 DIAGNOSIS — E611 Iron deficiency: Secondary | ICD-10-CM | POA: Diagnosis not present

## 2021-05-30 LAB — CBC WITH DIFFERENTIAL/PLATELET
Basophils Absolute: 0.1 10*3/uL (ref 0.0–0.1)
Basophils Relative: 1.2 % (ref 0.0–3.0)
Eosinophils Absolute: 0.2 10*3/uL (ref 0.0–0.7)
Eosinophils Relative: 4.7 % (ref 0.0–5.0)
HCT: 37.3 % (ref 36.0–46.0)
Hemoglobin: 12.7 g/dL (ref 12.0–15.0)
Lymphocytes Relative: 39.8 % (ref 12.0–46.0)
Lymphs Abs: 1.9 10*3/uL (ref 0.7–4.0)
MCHC: 34.1 g/dL (ref 30.0–36.0)
MCV: 94.3 fl (ref 78.0–100.0)
Monocytes Absolute: 0.4 10*3/uL (ref 0.1–1.0)
Monocytes Relative: 8.7 % (ref 3.0–12.0)
Neutro Abs: 2.2 10*3/uL (ref 1.4–7.7)
Neutrophils Relative %: 45.6 % (ref 43.0–77.0)
Platelets: 245 10*3/uL (ref 150.0–400.0)
RBC: 3.95 Mil/uL (ref 3.87–5.11)
RDW: 11.6 % (ref 11.5–15.5)
WBC: 4.7 10*3/uL (ref 4.0–10.5)

## 2021-05-30 LAB — FERRITIN: Ferritin: 109.6 ng/mL (ref 10.0–291.0)

## 2021-05-30 LAB — IRON: Iron: 86 ug/dL (ref 42–145)

## 2021-06-04 ENCOUNTER — Telehealth: Payer: Self-pay | Admitting: *Deleted

## 2021-06-04 NOTE — Telephone Encounter (Signed)
Received message added below from access nurse.   Called patient she has been taking every other day and noticed a lot if improvement until she came down with Covid. She has noticed sow improvement in last few weeks after covid. She will continue to take every other day unless  instructed by our office of any change. [facility] Night - Client Nonclinical Telephone Record  [facility] Client [facility] Night - Client Client Site [facility] - Night Physician [place], Idamae Schuller - MD Contact Type Call Who Is Calling Patient / Member / Family / Caregiver Caller Name Chastity Noland Caller Phone Number [phone] Call Type Message Only Information Provided Reason for Call Returning a Call from the Office Initial Comment Caller states that she is returning a call to the clinic. Additional Comment Caller states that she would just like another call back from the clinic. Confirmed the office was closed for a meeting until 2 p.m. Disp. Time Disposition Final User 06/04/2021 1:36:46 PM General Information Provided Yes Cherylynn Ridges Call Closed By: Cherylynn Ridges Transaction Date/Time: 06/04/2021 1:34:46 PM (ET)

## 2021-06-04 NOTE — Telephone Encounter (Signed)
-----   Message from Judy Pimple, MD sent at 06/02/2021 10:13 AM EDT ----- Iron level is improved (taking niferex every other day) Does she feel any better with a higher iron level?

## 2021-06-04 NOTE — Telephone Encounter (Signed)
Left VM requesting pt to call the office back 

## 2021-06-04 NOTE — Telephone Encounter (Signed)
Continue iron as she is taking it  Re check cbc and iron level in 2-3 months with ferritin Glad she is improving after covid-sounds like the iron is helping

## 2021-06-11 NOTE — Telephone Encounter (Signed)
Pt called returning a call  °

## 2021-06-11 NOTE — Telephone Encounter (Signed)
Tried calling patient, she did not answer LVM For her to call.

## 2021-06-13 ENCOUNTER — Encounter: Payer: Self-pay | Admitting: Family Medicine

## 2021-06-13 NOTE — Telephone Encounter (Signed)
Sent Dr. Royden Purl comments through [facility] and pt is aware

## 2021-07-13 ENCOUNTER — Other Ambulatory Visit: Payer: Self-pay | Admitting: Family Medicine

## 2021-09-13 ENCOUNTER — Telehealth: Payer: BC Managed Care – PPO | Admitting: Nurse Practitioner

## 2021-09-13 ENCOUNTER — Telehealth: Payer: BC Managed Care – PPO

## 2021-09-13 DIAGNOSIS — J0110 Acute frontal sinusitis, unspecified: Secondary | ICD-10-CM

## 2021-09-13 MED ORDER — DOXYCYCLINE HYCLATE 100 MG PO TABS: 100.0000 mg | ORAL_TABLET | Freq: Two times a day (BID) | ORAL | 0 refills | Status: AC

## 2021-09-13 NOTE — Progress Notes (Signed)
Virtual Visit Consent   [name], you are scheduled for a virtual visit with a [facility] provider today.     Just as with appointments in the office, your consent must be obtained to participate.  Your consent will be active for this visit and any virtual visit you may have with one of our providers in the next 365 days.     If you have a MyChart account, a copy of this consent can be sent to you electronically.  All virtual visits are billed to your insurance company just like a traditional visit in the office.    As this is a virtual visit, video technology does not allow for your provider to perform a traditional examination.  This may limit your provider's ability to fully assess your condition.  If your provider identifies any concerns that need to be evaluated in person or the need to arrange testing (such as labs, EKG, etc.), we will make arrangements to do so.     Although advances in technology are sophisticated, we cannot ensure that it will always work on either your end or our end.  If the connection with a video visit is poor, the visit may have to be switched to a telephone visit.  With either a video or telephone visit, we are not always able to ensure that we have a secure connection.     I need to obtain your verbal consent now.   Are you willing to proceed with your visit today?    Ruth Gill has provided verbal consent on 09/13/2021 for a virtual visit ( telephone). Unable to connect over video, spoke with patient over the phone for visit.    Viviano Simas, FNP   Date: 09/13/2021 9:49 AM   Virtual Visit via Video Note   I, Viviano Simas, connected with  Ruth Gill  ([id number], 01/24/1961) on 09/13/21 at  9:45 AM EST by a video-enabled telemedicine application and verified that I am speaking with the correct person using two identifiers.  Location: Patient: Virtual Visit Location Patient: Home Provider: Virtual Visit Location Provider: Home Office    I discussed the limitations of evaluation and management by telemedicine and the availability of in person appointments. The patient expressed understanding and agreed to proceed.    History of Present Illness: Ruth Gill is a 60 y.o. who identifies as a female who was assigned female at birth, and is being seen today with complaints of ongoing sinus congestion and pressure.   Her symptom onset was over one week ago with worsening symptoms today including facial/sinus pressure.   No fever   Using Alka seltzer OTC   Problems:  Patient Active Problem List   Diagnosis Date Noted   Iron deficiency 05/26/2021   Fatigue 04/15/2021   Bilateral hand pain 01/12/2020   Baker's cyst of knee, right 05/12/2017   Hypothyroid 04/13/2014   Colon cancer screening 01/12/2014   Routine general medical examination at a health care facility 01/04/2014   ALLERGIC RHINITIS 12/22/2007    Allergies:  Allergies  Allergen Reactions   Azithromycin     REACTION: joint swelling, stiffness   Mobic [Meloxicam] Hives    Looks like sun burn   Penicillins     REACTION: SOB, rash   Medications:  Current Outpatient Medications:    Biotin 5000 MCG CAPS, Take 1 capsule by mouth daily., Disp: , Rfl:    Cholecalciferol (VITAMIN D) 2000 UNITS tablet, Take 4,000 Units by mouth daily. ,  Disp: , Rfl:    diclofenac (VOLTAREN) 75 MG EC tablet, Take 1 tablet by mouth 2 (two) times daily., Disp: , Rfl:    estradiol (ESTRACE) 1 MG tablet, Take 1 tablet by mouth daily., Disp: , Rfl:    fexofenadine (ALLEGRA) 180 MG tablet, Take 180 mg by mouth daily as needed., Disp: , Rfl:    fluticasone (FLONASE) 50 MCG/ACT nasal spray, Place 2 sprays into both nostrils daily as needed., Disp: 16 g, Rfl: 11   iron polysaccharides (NIFEREX) 150 MG capsule, Take 1 capsule (150 mg total) by mouth every other day., Disp: 45 capsule, Rfl: 1   levothyroxine (SYNTHROID) 88 MCG tablet, TAKE 1 TABLET BY MOUTH EVERY DAY, Disp: 90 tablet,  Rfl: 1   meclizine (ANTIVERT) 12.5 MG tablet, Take 1 tablet (12.5 mg total) by mouth 3 (three) times daily as needed for dizziness., Disp: 15 tablet, Rfl: 0   Multiple Vitamins-Minerals (WOMENS MULTI GUMMIES) CHEW, Chew 1 tablet by mouth daily., Disp: , Rfl:    Omega-3 Fatty Acids (FISH OIL) 1200 MG CAPS, Take 1 capsule by mouth daily., Disp: , Rfl:   Observations/Objective:  Resting comfortably at home.  No labored breathing.  Speech is clear and coherent with logical content.  Patient is alert and oriented at baseline.    Assessment and Plan: 1. Acute non-recurrent frontal sinusitis  - doxycycline (VIBRA-TABS) 100 MG tablet; Take 1 tablet (100 mg total) by mouth 2 (two) times daily for 7 days.  Dispense: 14 tablet; Refill: 0       Continue to use OTC medications for support of congestion as needed, also advised to restart Flonase   Follow Up Instructions: I discussed the assessment and treatment plan with the patient. The patient was provided an opportunity to ask questions and all were answered. The patient agreed with the plan and demonstrated an understanding of the instructions.  A copy of instructions were sent to the patient via MyChart unless otherwise noted below.     The patient was advised to call back or seek an in-person evaluation if the symptoms worsen or if the condition fails to improve as anticipated.  Time:  I spent 15 minutes with the patient via telehealth technology discussing the above problems/concerns.    Viviano Simas, FNP

## 2021-09-24 ENCOUNTER — Ambulatory Visit: Payer: BC Managed Care – PPO | Admitting: Family Medicine

## 2021-09-24 ENCOUNTER — Encounter: Payer: Self-pay | Admitting: Family Medicine

## 2021-09-24 ENCOUNTER — Other Ambulatory Visit: Payer: Self-pay

## 2021-09-24 VITALS — BP 136/84 | HR 65 | Temp 98.3°F | Ht 62.75 in | Wt 130.1 lb

## 2021-09-24 DIAGNOSIS — J0100 Acute maxillary sinusitis, unspecified: Secondary | ICD-10-CM

## 2021-09-24 DIAGNOSIS — J019 Acute sinusitis, unspecified: Secondary | ICD-10-CM | POA: Insufficient documentation

## 2021-09-24 DIAGNOSIS — Z23 Encounter for immunization: Secondary | ICD-10-CM

## 2021-09-24 MED ORDER — DOXYCYCLINE HYCLATE 100 MG PO TABS: 100.0000 mg | ORAL_TABLET | Freq: Two times a day (BID) | ORAL | 0 refills | Status: DC

## 2021-09-24 NOTE — Progress Notes (Signed)
Subjective:    Patient ID: Ruth Gill, female    DOB: 06/15/1961, 60 y.o.   MRN: [id number]  This visit occurred during the SARS-CoV-2 public health emergency.  Safety protocols were in place, including screening questions prior to the visit, additional usage of staff PPE, and extensive cleaning of exam room while observing appropriate contact time as indicated for disinfecting solutions.   HPI Pt presents for sinusitis with cough   Wt Readings from Last 3 Encounters:  09/24/21 130 lb 2 oz (59 kg)  04/15/21 127 lb 6 oz (57.8 kg)  09/11/20 128 lb 2 oz (58.1 kg)   23.23 kg/m  Pt had a virtual visit and was treated with doxycycline for sinusitis on 12/17   Improved  Several days after finishing it her symptoms returned   Some nasal congestion  Nasal mucous has blood in it and tint of yellow (improved but not entirely clear) Ears are popping  No ear pain  Sinus pressure is under eyes - not as bad as it was  No longer had sore throat  Mild cough this am / dry tickle , a bit of phlegm this am   Covid test was neg yesterday  No fever or chills or body aches   Is exposed to grand children with germs   Flonase bid  Alka selzer cold Props up to sleep  Drinking lots of water  Humidifier   Patient Active Problem List   Diagnosis Date Noted   Acute sinusitis 09/24/2021   Iron deficiency 05/26/2021   Fatigue 04/15/2021   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 01/22/2020   Bilateral hand pain 01/12/2020   Baker's cyst of knee, right 05/12/2017   Hypothyroid 04/13/2014   Colon cancer screening 01/12/2014   Routine general medical examination at a health care facility 01/04/2014   ALLERGIC RHINITIS 12/22/2007   Past Medical History:  Diagnosis Date   Allergic rhinitis, cause unspecified    Allergy    Anemia, unspecified    Heart murmur    MVP   Mastodynia    Thyroid disease    hypothyroid   Past Surgical History:  Procedure  Laterality Date   ABDOMINAL HYSTERECTOMY     MOUTH SURGERY     Social History   Tobacco Use   Smoking status: Never   Smokeless tobacco: Never  Substance Use Topics   Alcohol use: No    Alcohol/week: 0.0 standard drinks   Drug use: No   Family History  Problem Relation Age of Onset   Colon cancer Neg Hx    Colon polyps Neg Hx    Esophageal cancer Neg Hx    Rectal cancer Neg Hx    Stomach cancer Neg Hx    Allergies  Allergen Reactions   Azithromycin     REACTION: joint swelling, stiffness   Mobic [Meloxicam] Hives    Looks like sun burn   Penicillins     REACTION: SOB, rash   Current Outpatient Medications on File Prior to Visit  Medication Sig Dispense Refill   Biotin 5000 MCG CAPS Take 1 capsule by mouth daily.     Cholecalciferol (VITAMIN D) 2000 UNITS tablet Take 4,000 Units by mouth daily.      diclofenac (VOLTAREN) 75 MG EC tablet Take 1 tablet by mouth 2 (two) times daily.     estradiol (ESTRACE) 1 MG tablet Take 1 tablet by mouth daily.     fexofenadine (ALLEGRA) 180 MG tablet Take 180 mg by mouth  daily as needed.     fluticasone (FLONASE) 50 MCG/ACT nasal spray Place 2 sprays into both nostrils daily as needed. 16 g 11   iron polysaccharides (NIFEREX) 150 MG capsule Take 1 capsule (150 mg total) by mouth every other day. 45 capsule 1   levothyroxine (SYNTHROID) 88 MCG tablet TAKE 1 TABLET BY MOUTH EVERY DAY 90 tablet 1   meclizine (ANTIVERT) 12.5 MG tablet Take 1 tablet (12.5 mg total) by mouth 3 (three) times daily as needed for dizziness. 15 tablet 0   Multiple Vitamins-Minerals (WOMENS MULTI GUMMIES) CHEW Chew 1 tablet by mouth daily.     Omega-3 Fatty Acids (FISH OIL) 1200 MG CAPS Take 1 capsule by mouth daily.     No current facility-administered medications on file prior to visit.    Review of Systems  Constitutional:  Negative for activity change, appetite change, fatigue, fever and unexpected weight change.  HENT:  Positive for congestion, postnasal  drip, rhinorrhea and sinus pressure. Negative for ear pain, sinus pain and sore throat.   Eyes:  Negative for pain, redness and visual disturbance.  Respiratory:  Positive for cough. Negative for shortness of breath, wheezing and stridor.   Cardiovascular:  Negative for chest pain and palpitations.  Gastrointestinal:  Negative for abdominal pain, blood in stool, constipation and diarrhea.  Endocrine: Negative for polydipsia and polyuria.  Genitourinary:  Negative for dysuria, frequency and urgency.  Musculoskeletal:  Negative for arthralgias, back pain and myalgias.  Skin:  Negative for pallor and rash.  Allergic/Immunologic: Negative for environmental allergies.  Neurological:  Negative for dizziness, syncope and headaches.  Hematological:  Negative for adenopathy. Does not bruise/bleed easily.  Psychiatric/Behavioral:  Negative for decreased concentration and dysphoric mood. The patient is not nervous/anxious.       Objective:   Physical Exam Constitutional:      General: She is not in acute distress.    Appearance: Normal appearance. She is well-developed and normal weight. She is not ill-appearing.  HENT:     Head: Normocephalic and atraumatic.     Comments: Bilateral maxillary and frontal sinus tenderness    Right Ear: Tympanic membrane and external ear normal.     Left Ear: Tympanic membrane and external ear normal.     Nose: Congestion and rhinorrhea present.     Mouth/Throat:     Pharynx: Oropharynx is clear. No oropharyngeal exudate or posterior oropharyngeal erythema.     Comments: Clear pnd Eyes:     General:        Right eye: No discharge.        Left eye: No discharge.     Conjunctiva/sclera: Conjunctivae normal.     Pupils: Pupils are equal, round, and reactive to light.  Cardiovascular:     Rate and Rhythm: Normal rate and regular rhythm.  Pulmonary:     Effort: Pulmonary effort is normal. No respiratory distress.     Breath sounds: Normal breath sounds. No  wheezing or rales.     Comments: Good air exch No rales or rhonchi Musculoskeletal:     Cervical back: Normal range of motion and neck supple.  Lymphadenopathy:     Cervical: No cervical adenopathy.  Skin:    General: Skin is warm and dry.     Findings: No rash.  Neurological:     Mental Status: She is alert.     Cranial Nerves: No cranial nerve deficit.     Coordination: Coordination normal.  Psychiatric:  Mood and Affect: Mood normal.          Assessment & Plan:   Problem List Items Addressed This Visit       Respiratory   Acute sinusitis    Treatment with doxy (allergic to pcn and azithro) helped but symptoms rebounded Reassuring exam  Will extend course for 7d  Close f/u Discussed symptom care  Fluids/rest ER precautions reviewed  Update if not starting to improve in a week or if worsening        Relevant Medications   doxycycline (VIBRA-TABS) 100 MG tablet   Other Visit Diagnoses     Need for influenza vaccination    -  Primary   Relevant Orders   Flu Vaccine QUAD 6+ mos PF IM (Fluarix Quad PF) (Completed)

## 2021-09-24 NOTE — Patient Instructions (Addendum)
Look into a netti pot for sinus rinse    Take doxycycline as directed for sinus infection  Continue flonase (double up for 2 days then go back to present dose)   Update if not starting to improve in a week or if worsening   If no improvement would consider prednisone

## 2021-09-24 NOTE — Assessment & Plan Note (Signed)
Treatment with doxy (allergic to pcn and azithro) helped but symptoms rebounded Reassuring exam  Will extend course for 7d  Close f/u Discussed symptom care  Fluids/rest ER precautions reviewed  Update if not starting to improve in a week or if worsening

## 2021-10-10 ENCOUNTER — Encounter: Payer: Self-pay | Admitting: Family Medicine

## 2021-10-12 MED ORDER — PREDNISONE 10 MG PO TABS: ORAL_TABLET | ORAL | 0 refills | Status: DC

## 2021-11-25 ENCOUNTER — Telehealth: Payer: Self-pay | Admitting: Family Medicine

## 2021-11-25 DIAGNOSIS — E039 Hypothyroidism, unspecified: Secondary | ICD-10-CM

## 2021-11-25 DIAGNOSIS — E611 Iron deficiency: Secondary | ICD-10-CM

## 2021-11-25 DIAGNOSIS — Z0000 Encounter for general adult medical examination without abnormal findings: Secondary | ICD-10-CM

## 2021-11-25 NOTE — Telephone Encounter (Signed)
-----   Message from Ellamae Sia sent at 11/14/2021  3:04 PM EST ----- Regarding: Lab orders for Wednesday, 3.1.23 Patient is scheduled for CPX labs, please order future labs, Thanks , Karna Christmas

## 2021-11-26 ENCOUNTER — Other Ambulatory Visit (INDEPENDENT_AMBULATORY_CARE_PROVIDER_SITE_OTHER): Payer: BC Managed Care – PPO

## 2021-11-26 ENCOUNTER — Other Ambulatory Visit: Payer: Self-pay

## 2021-11-26 DIAGNOSIS — Z0000 Encounter for general adult medical examination without abnormal findings: Secondary | ICD-10-CM | POA: Diagnosis not present

## 2021-11-26 DIAGNOSIS — E611 Iron deficiency: Secondary | ICD-10-CM

## 2021-11-26 DIAGNOSIS — E039 Hypothyroidism, unspecified: Secondary | ICD-10-CM | POA: Diagnosis not present

## 2021-11-26 LAB — CBC WITH DIFFERENTIAL/PLATELET
Basophils Absolute: 0.1 10*3/uL (ref 0.0–0.1)
Basophils Relative: 1.5 % (ref 0.0–3.0)
Eosinophils Absolute: 0.6 10*3/uL (ref 0.0–0.7)
Eosinophils Relative: 10.4 % — ABNORMAL HIGH (ref 0.0–5.0)
HCT: 39.3 % (ref 36.0–46.0)
Hemoglobin: 13.1 g/dL (ref 12.0–15.0)
Lymphocytes Relative: 37.3 % (ref 12.0–46.0)
Lymphs Abs: 2.2 10*3/uL (ref 0.7–4.0)
MCHC: 33.3 g/dL (ref 30.0–36.0)
MCV: 96.6 fl (ref 78.0–100.0)
Monocytes Absolute: 0.5 10*3/uL (ref 0.1–1.0)
Monocytes Relative: 8.2 % (ref 3.0–12.0)
Neutro Abs: 2.5 10*3/uL (ref 1.4–7.7)
Neutrophils Relative %: 42.6 % — ABNORMAL LOW (ref 43.0–77.0)
Platelets: 211 10*3/uL (ref 150.0–400.0)
RBC: 4.07 Mil/uL (ref 3.87–5.11)
RDW: 12.2 % (ref 11.5–15.5)
WBC: 6 10*3/uL (ref 4.0–10.5)

## 2021-11-26 LAB — COMPREHENSIVE METABOLIC PANEL
ALT: 12 U/L (ref 0–35)
AST: 17 U/L (ref 0–37)
Albumin: 4.6 g/dL (ref 3.5–5.2)
Alkaline Phosphatase: 42 U/L (ref 39–117)
BUN: 19 mg/dL (ref 6–23)
CO2: 33 mEq/L — ABNORMAL HIGH (ref 19–32)
Calcium: 9.5 mg/dL (ref 8.4–10.5)
Chloride: 102 mEq/L (ref 96–112)
Creatinine, Ser: 0.91 mg/dL (ref 0.40–1.20)
GFR: 67.73 mL/min (ref >60.00)
Glucose, Bld: 87 mg/dL (ref 70–99)
Potassium: 4.7 mEq/L (ref 3.5–5.1)
Sodium: 139 mEq/L (ref 135–145)
Total Bilirubin: 0.4 mg/dL (ref 0.2–1.2)
Total Protein: 7.2 g/dL (ref 6.0–8.3)

## 2021-11-26 LAB — IRON: Iron: 64 ug/dL (ref 42–145)

## 2021-11-26 LAB — LIPID PANEL
Cholesterol: 224 mg/dL — ABNORMAL HIGH (ref 0–200)
HDL: 67.5 mg/dL (ref >39.00)
LDL Cholesterol: 136 mg/dL — ABNORMAL HIGH (ref 0–99)
NonHDL: 156.98
Total CHOL/HDL Ratio: 3
Triglycerides: 107 mg/dL (ref 0.0–149.0)
VLDL: 21.4 mg/dL (ref 0.0–40.0)

## 2021-11-26 LAB — TSH: TSH: 3.76 u[IU]/mL (ref 0.35–5.50)

## 2021-12-03 ENCOUNTER — Encounter: Payer: Self-pay | Admitting: Family Medicine

## 2021-12-03 ENCOUNTER — Ambulatory Visit (INDEPENDENT_AMBULATORY_CARE_PROVIDER_SITE_OTHER): Payer: BC Managed Care – PPO | Admitting: Family Medicine

## 2021-12-03 ENCOUNTER — Other Ambulatory Visit: Payer: Self-pay

## 2021-12-03 VITALS — BP 122/80 | HR 63 | Temp 97.8°F | Ht 62.75 in | Wt 128.0 lb

## 2021-12-03 DIAGNOSIS — J301 Allergic rhinitis due to pollen: Secondary | ICD-10-CM | POA: Diagnosis not present

## 2021-12-03 DIAGNOSIS — Z1211 Encounter for screening for malignant neoplasm of colon: Secondary | ICD-10-CM

## 2021-12-03 DIAGNOSIS — E039 Hypothyroidism, unspecified: Secondary | ICD-10-CM

## 2021-12-03 DIAGNOSIS — Z0000 Encounter for general adult medical examination without abnormal findings: Secondary | ICD-10-CM

## 2021-12-03 DIAGNOSIS — E611 Iron deficiency: Secondary | ICD-10-CM

## 2021-12-03 DIAGNOSIS — E785 Hyperlipidemia, unspecified: Secondary | ICD-10-CM | POA: Insufficient documentation

## 2021-12-03 DIAGNOSIS — E7800 Pure hypercholesterolemia, unspecified: Secondary | ICD-10-CM

## 2021-12-03 MED ORDER — LEVOTHYROXINE SODIUM 88 MCG PO TABS: 88.0000 ug | ORAL_TABLET | Freq: Every day | ORAL | 3 refills | Status: DC

## 2021-12-03 MED ORDER — FLUTICASONE PROPIONATE 50 MCG/ACT NA SUSP: 2.0000 | Freq: Every day | NASAL | 11 refills | Status: DC | PRN

## 2021-12-03 NOTE — Assessment & Plan Note (Signed)
Balanced diet  ?Pt feels better with iron level in the 60s vs the 40s ?Lab Results  ?Component Value Date  ? IRON 64 11/26/2021  ? FERRITIN 109.6 05/30/2021  ?  ?Ferritin is not too high  ?Will continue niferex QOD ?

## 2021-12-03 NOTE — Assessment & Plan Note (Signed)
Colonoscopy utd from 2020  

## 2021-12-03 NOTE — Patient Instructions (Addendum)
For cholesterol  ?Avoid red meat/ fried foods/ egg yolks/ fatty breakfast meats/ butter, cheese and high fat dairy/ and shellfish   ?We will watch cholesterol  ? ?Have your gyn send [place] a copy of your mammogram report  ? ?Keep taking care of yourself  ? ?Wear sun protection  ? ? ? ? ?

## 2021-12-03 NOTE — Assessment & Plan Note (Signed)
Mild  ?Disc goals for lipids and reasons to control them ?Rev last labs with pt ?Rev low sat fat diet in detail ?Will montiror ?LDL up to 136 after eating country ham ?If this inc despite good diet later it may be genetic and can consider statin ?Handout given   ? ?

## 2021-12-03 NOTE — Assessment & Plan Note (Signed)
Reviewed health habits including diet and exercise and skin cancer prevention ?Reviewed appropriate screening tests for age  ?Also reviewed health mt list, fam hx and immunization status , as well as social and family history   ?See hpi ?Labs reviewed  ?Gyn care utd/still on hrt ?Colonoscopy utd ?Mammogram scheduled for late march at her gyn ?Enc to get back on track with exercise  ?Continue sun protection and consider derm screening visit in light of fair complexion  ?

## 2021-12-03 NOTE — Progress Notes (Signed)
? ?Subjective:  ? ? Patient ID: Ruth Gill, female    DOB: 1961-09-18, 61 y.o.   MRN: [id number] ?This visit occurred during the SARS-CoV-2 public health emergency.  Safety protocols were in place, including screening questions prior to the visit, additional usage of staff PPE, and extensive cleaning of exam room while observing appropriate contact time as indicated for disinfecting solutions.  ? ?HPI ?Here for health maintenance exam and to review chronic medical problems   ? ?Wt Readings from Last 3 Encounters:  ?12/03/21 128 lb (58.1 kg)  ?09/24/21 130 lb 2 oz (59 kg)  ?04/15/21 127 lb 6 oz (57.8 kg)  ? ?22.86 kg/m? ?Weight is stable  ? ?Nothing new  ?Doing ok  ?Was on a good streak of exercise and then got off track  ?Does the beach body program-plans to start now  ? ?Covid imm 2021 ?Had shingrix vaccine  ?Tdap 2015 ?Flu shot 08/2021 ? ?Colonoscopy 09/2018 ? ?Mammogram 11/2020 [facility] ob[facility]yn), she has appt later this month  ?Self breast exam : no lumps  ?Past hysterectomy  ? ?Estrace from gyn  ? ? ?BP Readings from Last 3 Encounters:  ?12/03/21 122/80  ?09/24/21 136/84  ?04/15/21 118/66  ? ?Pulse Readings from Last 3 Encounters:  ?12/03/21 63  ?09/24/21 65  ?04/15/21 64  ? ?Allergic rhinitis  ?Needs refill of flonase  ?Allegra  ? ?Hypothyroidism  ?Pt has no clinical changes ?No change in energy level/ hair or skin/ edema and no tremor ?Lab Results  ?Component Value Date  ? TSH 3.76 11/26/2021  ?  ?Taking levothyroxine 88 mcg daily  ? ?Hands bother her ?Plans to go back for more shots for arthritis  ?R thumb is the worst  ? ?H/o iron deficiency ?Lab Results  ?Component Value Date  ? WBC 6.0 11/26/2021  ? HGB 13.1 11/26/2021  ? HCT 39.3 11/26/2021  ? MCV 96.6 11/26/2021  ? PLT 211.0 11/26/2021  ? ?Lab Results  ?Component Value Date  ? IRON 64 11/26/2021  ? FERRITIN 109.6 05/30/2021  ? ?Takes niferex 150 mg every other day  ?Iron level was 44 in the summer (low normal) ?Feels better with it  ?Does not  bother her stomach ? ?Cholesterol ?Lab Results  ?Component Value Date  ? CHOL 224 (H) 11/26/2021  ? CHOL 186 09/04/2020  ? CHOL 186 09/05/2019  ? ?Lab Results  ?Component Value Date  ? HDL 67.50 11/26/2021  ? HDL 62.70 09/04/2020  ? HDL 62.40 09/05/2019  ? ?Lab Results  ?Component Value Date  ? LDLCALC 136 (H) 11/26/2021  ? LDLCALC 105 (H) 09/04/2020  ? LDLCALC 110 (H) 09/05/2019  ? ?Lab Results  ?Component Value Date  ? TRIG 107.0 11/26/2021  ? TRIG 91.0 09/04/2020  ? TRIG 68.0 09/05/2019  ? ?Lab Results  ?Component Value Date  ? CHOLHDL 3 11/26/2021  ? CHOLHDL 3 09/04/2020  ? CHOLHDL 3 09/05/2019  ? ?No results found for: LDLDIRECT ? ?Avoids cheese and eggs  ?No fatty meat  ?No beef  ?Some country ham  ?No bacon or sausage  ?No fried food  ?No shellfish  ? ?The 10-year ASCVD risk score (Arnett DK, et al., 2019) is: 3.6% ?  Values used to calculate the score: ?    Age: 89 years ?    Sex: Female ?    Is Non-Hispanic African American: No ?    Diabetic: No ?    Tobacco smoker: No ?    Systolic Blood Pressure: 122 mmHg ?  Is BP treated: No ?    HDL Cholesterol: 67.5 mg/dL ?    Total Cholesterol: 224 mg/dL ? ? ?Other labs ?Lab Results  ?Component Value Date  ? CREATININE 0.91 11/26/2021  ? BUN 19 11/26/2021  ? NA 139 11/26/2021  ? K 4.7 11/26/2021  ? CL 102 11/26/2021  ? CO2 33 (H) 11/26/2021  ? ?Lab Results  ?Component Value Date  ? ALT 12 11/26/2021  ? AST 17 11/26/2021  ? ALKPHOS 42 11/26/2021  ? BILITOT 0.4 11/26/2021  ? ?Lab Results  ?Component Value Date  ? TSH 3.76 11/26/2021  ?  ? ?Prior ?Lab Results  ?Component Value Date  ? AGTXMIWO03 379 04/15/2021  ? ?Vit D normal ?Review of Systems ? ?   ?Objective:  ? Physical Exam ?Constitutional:   ?   General: She is not in acute distress. ?   Appearance: Normal appearance. She is well-developed and normal weight. She is not ill-appearing or diaphoretic.  ?HENT:  ?   Head: Normocephalic and atraumatic.  ?   Right Ear: Tympanic membrane, ear canal and external ear  normal.  ?   Left Ear: Tympanic membrane, ear canal and external ear normal.  ?   Nose: Nose normal. No congestion.  ?   Mouth/Throat:  ?   Mouth: Mucous membranes are moist.  ?   Pharynx: Oropharynx is clear. No posterior oropharyngeal erythema.  ?Eyes:  ?   General: No scleral icterus. ?   Extraocular Movements: Extraocular movements intact.  ?   Conjunctiva/sclera: Conjunctivae normal.  ?   Pupils: Pupils are equal, round, and reactive to light.  ?Neck:  ?   Thyroid: No thyromegaly.  ?   Vascular: No carotid bruit or JVD.  ?Cardiovascular:  ?   Rate and Rhythm: Normal rate and regular rhythm.  ?   Pulses: Normal pulses.  ?   Heart sounds: Normal heart sounds.  ?  No gallop.  ?Pulmonary:  ?   Effort: Pulmonary effort is normal. No respiratory distress.  ?   Breath sounds: Normal breath sounds. No wheezing.  ?   Comments: Good air exch ?Chest:  ?   Chest wall: No tenderness.  ?Abdominal:  ?   General: Bowel sounds are normal. There is no distension or abdominal bruit.  ?   Palpations: Abdomen is soft. There is no mass.  ?   Tenderness: There is no abdominal tenderness.  ?   Hernia: No hernia is present.  ?Genitourinary: ?   Comments: Breast and pelvic exam are done by gyn provider  ?Musculoskeletal:     ?   General: No tenderness. Normal range of motion.  ?   Cervical back: Normal range of motion and neck supple. No rigidity. No muscular tenderness.  ?   Right lower leg: No edema.  ?   Left lower leg: No edema.  ?Lymphadenopathy:  ?   Cervical: No cervical adenopathy.  ?Skin: ?   General: Skin is warm and dry.  ?   Coloration: Skin is not pale.  ?   Findings: No erythema or rash.  ?   Comments: Solar lentigines diffusely ? ?Few darker nevi on back /stable  ? ?Both thumb nails have vertical splits in them (lateral)  ? ?  ?Neurological:  ?   Mental Status: She is alert. Mental status is at baseline.  ?   Cranial Nerves: No cranial nerve deficit.  ?   Motor: No abnormal muscle tone.  ?   Coordination: Coordination  normal.  ?   Gait: Gait normal.  ?   Deep Tendon Reflexes: Reflexes are normal and symmetric.  ?Psychiatric:     ?   Mood and Affect: Mood normal.     ?   Cognition and Memory: Cognition and memory normal.  ? ? ? ? ? ?   ?Assessment & Plan:  ? ?Problem List Items Addressed This Visit   ? ?  ? Respiratory  ? Allergic rhinitis  ?  Refilled flonase ns  ?Has allegra prn  ?Pollen avoidance when able ?  ?  ?  ? Endocrine  ? Hypothyroid  ?  Hypothyroidism  ?Pt has no clinical changes ?No change in energy level/ hair or skin/ edema and no tremor ?Lab Results  ?Component Value Date  ? TSH 3.76 11/26/2021  ?  Plan to continue levothyroxine 88 mcg daily  ? ?  ?  ?  ? Other  ? Colon cancer screening  ?  Colonoscopy utd from 2020 ?  ?  ? Hyperlipidemia  ?  Mild  ?Disc goals for lipids and reasons to control them ?Rev last labs with pt ?Rev low sat fat diet in detail ?Will montiror ?LDL up to 136 after eating country ham ?If this inc despite good diet later it may be genetic and can consider statin ?Handout given   ? ?  ?  ? Iron deficiency  ?  Balanced diet  ?Pt feels better with iron level in the 60s vs the 40s ?Lab Results  ?Component Value Date  ? IRON 64 11/26/2021  ? FERRITIN 109.6 05/30/2021  ?  ?Ferritin is not too high  ?Will continue niferex QOD ?  ?  ? Routine general medical examination at a health care facility - Primary  ?  Reviewed health habits including diet and exercise and skin cancer prevention ?Reviewed appropriate screening tests for age  ?Also reviewed health mt list, fam hx and immunization status , as well as social and family history   ?See hpi ?Labs reviewed  ?Gyn care utd/still on hrt ?Colonoscopy utd ?Mammogram scheduled for late march at her gyn ?Enc to get back on track with exercise  ?Continue sun protection and consider derm screening visit in light of fair complexion  ?  ?  ? ? ?

## 2021-12-03 NOTE — Assessment & Plan Note (Signed)
Hypothyroidism  ?Pt has no clinical changes ?No change in energy level/ hair or skin/ edema and no tremor ?Lab Results  ?Component Value Date  ? TSH 3.76 11/26/2021  ?  Plan to continue levothyroxine 88 mcg daily  ? ?

## 2021-12-03 NOTE — Assessment & Plan Note (Signed)
Refilled flonase ns  ?Has allegra prn  ?Pollen avoidance when able ?

## 2021-12-22 LAB — HM MAMMOGRAPHY

## 2022-01-05 ENCOUNTER — Other Ambulatory Visit: Payer: Self-pay | Admitting: Family Medicine

## 2022-01-06 ENCOUNTER — Encounter: Payer: Self-pay | Admitting: Family Medicine

## 2022-01-28 ENCOUNTER — Telehealth: Payer: Self-pay

## 2022-01-28 ENCOUNTER — Encounter: Payer: Self-pay | Admitting: Family Medicine

## 2022-01-28 ENCOUNTER — Ambulatory Visit: Payer: BC Managed Care – PPO | Admitting: Family Medicine

## 2022-01-28 DIAGNOSIS — S60461A Insect bite (nonvenomous) of left index finger, initial encounter: Secondary | ICD-10-CM | POA: Diagnosis not present

## 2022-01-28 DIAGNOSIS — S60469A Insect bite (nonvenomous) of unspecified finger, initial encounter: Secondary | ICD-10-CM | POA: Insufficient documentation

## 2022-01-28 DIAGNOSIS — W57XXXA Bitten or stung by nonvenomous insect and other nonvenomous arthropods, initial encounter: Secondary | ICD-10-CM | POA: Diagnosis not present

## 2022-01-28 MED ORDER — PREDNISONE 10 MG PO TABS
ORAL_TABLET | ORAL | 0 refills | Status: DC
Start: 2022-01-28 — End: 2022-05-04

## 2022-01-28 NOTE — Progress Notes (Signed)
? ?Subjective:  ? ? Patient ID: Ruth Gill, female    DOB: 1961/07/02, 61 y.o.   MRN: [id number] ? ?HPI ?Pt presents with insect bite of L hand ? ?Wt Readings from Last 3 Encounters:  ?01/28/22 130 lb 2 oz (59 kg)  ?12/03/21 128 lb (58.1 kg)  ?09/24/21 130 lb 2 oz (59 kg)  ? ?23.23 kg/m? ? ?Yesterday  ?Moving some flowers -then went inside her finger itched and was red  ? ?L index finger - bite? ?Then red and swollen-hand  ?Today less swollen ut still red  ?To the wrist  ?Fingers are less tight  ? ?Not as itchy today  ? ?She used topical benadryl  ?Takes allegra 180 mg   ?Used ice on it all evening  ?And elevated  ? ?Patient Active Problem List  ? Diagnosis Date Noted  ? Insect bite of finger 01/28/2022  ? Hyperlipidemia 12/03/2021  ? Iron deficiency 05/26/2021  ? Fatigue 04/15/2021  ? 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 Bilateral hand pain 01/12/2020  ? Hypothyroid 04/13/2014  ? Colon cancer screening 01/12/2014  ? Routine general medical examination at a health care facility 01/04/2014  ? Allergic rhinitis 12/22/2007  ? ?Past Medical History:  ?Diagnosis Date  ? Allergic rhinitis, cause unspecified   ? Allergy   ? Anemia, unspecified   ? Heart murmur   ? MVP  ? Mastodynia   ? Thyroid disease   ? hypothyroid  ? ?Past Surgical History:  ?Procedure Laterality Date  ? ABDOMINAL HYSTERECTOMY    ? MOUTH SURGERY    ? ?Social History  ? ?Tobacco Use  ? Smoking status: Never  ? Smokeless tobacco: Never  ?Substance Use Topics  ? Alcohol use: No  ?  Alcohol/week: 0.0 standard drinks  ? Drug use: No  ? ?Family History  ?Problem Relation Age of Onset  ? Colon cancer Neg Hx   ? Colon polyps Neg Hx   ? Esophageal cancer Neg Hx   ? Rectal cancer Neg Hx   ? Stomach cancer Neg Hx   ? ?Allergies  ?Allergen Reactions  ? Azithromycin   ?  REACTION: joint swelling, stiffness  ? Mobic [Meloxicam] Hives  ?  Looks like sun burn  ? Penicillins   ?  REACTION: SOB, rash  ? ?Current  Outpatient Medications on File Prior to Visit  ?Medication Sig Dispense Refill  ? Biotin 5000 MCG CAPS Take 1 capsule by mouth daily.    ? Cholecalciferol (VITAMIN D) 2000 UNITS tablet Take 4,000 Units by mouth daily.     ? diclofenac (VOLTAREN) 75 MG EC tablet Take 1 tablet by mouth 2 (two) times daily.    ? estradiol (ESTRACE) 1 MG tablet Take 1 tablet by mouth daily.    ? fexofenadine (ALLEGRA) 180 MG tablet Take 180 mg by mouth daily as needed.    ? fluticasone (FLONASE) 50 MCG/ACT nasal spray Place 2 sprays into both nostrils daily as needed. 16 g 11  ? iron polysaccharides (NIFEREX) 150 MG capsule TAKE 1 CAPSULE (150 MG TOTAL) BY MOUTH EVERY OTHER DAY. 45 capsule 1  ? levothyroxine (SYNTHROID) 88 MCG tablet Take 1 tablet (88 mcg total) by mouth daily. 90 tablet 3  ? meclizine (ANTIVERT) 12.5 MG tablet Take 1 tablet (12.5 mg total) by mouth 3 (three) times daily as needed for dizziness. 15 tablet 0  ? Multiple Vitamins-Minerals (WOMENS MULTI GUMMIES) CHEW Chew 1 tablet by mouth daily.    ?  Omega-3 Fatty Acids (FISH OIL) 1200 MG CAPS Take 1 capsule by mouth daily.    ? ?No current facility-administered medications on file prior to visit.  ?  ?Review of Systems  ?Constitutional:  Negative for activity change, appetite change, fatigue, fever and unexpected weight change.  ?HENT:  Negative for congestion, ear pain, rhinorrhea, sinus pressure and sore throat.   ?Eyes:  Negative for pain, redness and visual disturbance.  ?Respiratory:  Negative for cough, shortness of breath and wheezing.   ?Cardiovascular:  Negative for chest pain and palpitations.  ?Gastrointestinal:  Negative for abdominal pain, blood in stool, constipation and diarrhea.  ?Endocrine: Negative for polydipsia and polyuria.  ?Genitourinary:  Negative for dysuria, frequency and urgency.  ?Musculoskeletal:  Negative for arthralgias, back pain and myalgias.  ?Skin:  Positive for color change and rash. Negative for pallor and wound.   ?Allergic/Immunologic: Negative for environmental allergies.  ?Neurological:  Negative for dizziness, syncope and headaches.  ?Hematological:  Negative for adenopathy. Does not bruise/bleed easily.  ?Psychiatric/Behavioral:  Negative for decreased concentration and dysphoric mood. The patient is not nervous/anxious.   ? ?   ?Objective:  ? Physical Exam ?Constitutional:   ?   Appearance: Normal appearance. She is normal weight.  ?HENT:  ?   Head: Normocephalic and atraumatic.  ?Eyes:  ?   General:     ?   Right eye: No discharge.     ?   Left eye: No discharge.  ?   Conjunctiva/sclera: Conjunctivae normal.  ?   Pupils: Pupils are equal, round, and reactive to light.  ?Cardiovascular:  ?   Rate and Rhythm: Regular rhythm.  ?   Heart sounds: Normal heart sounds.  ?Pulmonary:  ?   Effort: Pulmonary effort is normal. No respiratory distress.  ?Musculoskeletal:  ?   Cervical back: Neck supple.  ?Lymphadenopathy:  ?   Cervical: No cervical adenopathy.  ?Skin: ?   Findings: Erythema present. No bruising.  ?   Comments: Mild erythema and swelling of L hand /dorsum  ?No scab or skin breakdown ?Nl sens/perfusion  ?Non tender ?Nl rom of fingers   ?Neurological:  ?   Mental Status: She is alert.  ?   Sensory: No sensory deficit.  ?Psychiatric:     ?   Mood and Affect: Mood normal.  ? ? ? ? ? ?   ?Assessment & Plan:  ? ?Problem List Items Addressed This Visit   ? ?  ? Musculoskeletal and Integument  ? Insect bite of finger  ?  Bite on L index finger yesterday/outdoors ?Now swollen/red/itching (improved swelling from yesterday) ?Reassuring exam ?Doubt infection because reaction was immediate ?inst to keep clean/ use ice and topical cortisone ?Prednisone taper px (rev side eff) ?Update if not starting to improve in a week or if worsening   ?Watch for ache/pain as well as fever ? ?  ?  ? ? ?

## 2022-01-28 NOTE — Telephone Encounter (Signed)
[facility] Day - Client ?TELEPHONE ADVICE RECORD ?AccessNurse? ?Patient ?Name: ?Ruth C ?Gill ?Gender: Female ?DOB: [birth year] ?Age: 61 Y 5 M 10 D ?Return ?Phone ?Number: ?[phone] ?(Primary) ?Address: ?City/ ?State/ ?Zip: ?[place] [address] ?Client [facility] Day - Client ?Client Site [facility] - Day ?Provider Tower, Idamae Schuller - MD ?Contact Type Call ?Who Is Calling Patient / Member / Family / Caregiver ?Call Type Triage / Clinical ?Relationship To Patient Self ?Return Phone Number [phone] (Primary) ?Chief Complaint Insect Bite ?Reason for Call Symptomatic / Request for Health Information ?Initial Comment Caller states she had an insect bite. Her hand is red ?and swollen. ?Translation No ?Nurse Assessment ?Nurse: Doylene Canard, RN, Rinaldo Cloud Date/Time (Eastern Time): 01/28/2022 8:54:57 AM ?Confirm and document reason for call. If ?symptomatic, describe symptoms. ?---Caller states she got an insect bite on left index ?finger yesterday afternoon, now whole hand hand is ?red and swollen. Has used ice and Benadryl cream, ?elevated. swelling is less, but redness is spreading ?to wrist area and fingers are swollen. Denies pain or ?fever. ?Does the patient have any new or worsening ?symptoms? ---Yes ?Will a triage be completed? ---Yes ?Related visit to physician within the last 2 weeks? ---No ?Does the PT have any chronic conditions? (i.e. ?diabetes, asthma, this includes High risk factors for ?pregnancy, etc.) ?---Yes ?List chronic conditions. ---Hypothyroidism ?Is this a behavioral health or substance abuse call? ---No ?Guidelines ?Guideline Title Affirmed Question Affirmed Notes Nurse Date/Time (Eastern ?Time) ?Insect Bite Itchy insect bite Doylene Canard, RN, Rinaldo Cloud 01/28/2022 9:05:20 AM ?Insect Bite Itchy insect bite Doylene Canard, RN, Rinaldo Cloud 01/28/2022 9:15:05 AM ?Disp. Time (Eastern ?Time) Disposition Final User ?01/28/2022 9:10:57 AM Home Care Conner, RN, Rinaldo Cloud ?01/28/2022  9:16:12 AM See PCP within 24 Hours Yes Conner, RN, Rinaldo Cloud ?PLEASE NOTE: All timestamps contained within this report are represented as [place] Standard Time. ?CONFIDENTIALTY NOTICE: This fax transmission is intended only for the addressee. It contains information that is legally privileged, confidential or ?otherwise protected from use or disclosure. If you are not the intended recipient, you are strictly prohibited from reviewing, disclosing, copying using ?or disseminating any of this information or taking any action in reliance on or regarding this information. If you have received this fax in error, please ?notify us immediately by telephone so that we can arrange for its return to [place]. Phone: [phone], Toll-Free: [phone], Fax: [phone] ?Page: 2 of 2 ?Call Id: [id number] ?Disposition Overriden: Home Care ?Override Reason: Patient?s symptoms need a higher level of care ?Caller Disagree/Comply Comply ?Caller Understands Yes ?PreDisposition Call Doctor ?Care Advice Given Per Guideline ?HOME CARE: * You should be able to treat this at home. REASSURANCE AND EDUCATION - INSECT BITE(S): * The most ?common symptom of an insect bite is LOCAL SKIN REACTION with a SMALL RED BUMP. THERE MAY BE many small ?red bumps if you were bitten multiple times. Other symptoms are: ITCHING, PAIN, small area of localized hives (a welt or slight ?swelling), or small area of localized redness. Sometimes a tiny water blister forms in the center of the insect bite. * Most insect bites ?can be managed at home with self-care (e.g., cold packs) and over-the-counter medicines (e.g., antihistamines, pain medicines). ?FOUR SIMPLE HOME REMEDIES FOR ITCHY INSECT BITES: * Rub bite(s) with an ice cube for 30 seconds. * Apply calamine ?lotion to the bite(s). * Make baking soda paste by mixing baking soda with a small amount of water. Apply the paste to the bite(s). ?HYDROCORTISONE CREAM FOR SEVERE  ITCHING: * Put 1% hydrocortisone cream on the  itchy area(s) 3 times a day. Use ?it for a couple days, until it feels better. This will help decrease the itching. EXPECTED COURSE: * Most insect bites are itchy and ?puffy for several days. * Any pinkness or redness usually lasts 3 days. CALL BACK IF: * Severe pain persists over 2 hours after pain ?medicine * Bite looks infected (pus, red streaks, increased tenderness) * Redness getting larger and more than 48 hours after the bite ?* You become worse CARE ADVICE given per Insect Bite (Adult) guideline. ?SEE PCP WITHIN 24 HOURS: * IF OFFICE WILL BE OPEN: You need to be examined within the next 24 hours. Call your ?doctor (or NP/PA) when the office opens and make an appointment. PAIN MEDICINES: * For pain relief, you can take either ?acetaminophen, ibuprofen, or naproxen. CALL BACK IF: * Fever occurs * You become worse TWO SIMPLE HOME REMEDIES ?FOR PAINFUL INSECT BITES: * Rub a bite with an ice cube for 30 seconds. * Make baking soda paste by mixing baking ?soda with a small amount of water. Apply the paste to the bites using a cotton ball or some gauze. TETANUS SHOT: * Most ?doctor's offices give tetanus shots. You can also get a tetanus shot at retail clinics (drugstore clinics) and at urgent care centers. ?HYDROCORTISONE CREAM FOR SEVERE ITCHING: * You can use hydrocortisone for severe itching. * This is an over-thecounter (OTC) drug. You can buy it at the drugstore. EXPECTED COURSE: * Most insect bites are itchy and puffy for several days. ?* Any pinkness or redness usually lasts 3 days. CALL BACK IF: * Severe pain persists over 2 hours after pain medicine * Bite has ?not healed after 14 days * Bite looks infected (pus, red streaks, increased tenderness) * Redness getting larger and more than 48 hours ?after the bite * You become worse ?Comments ?User: Ruth Rowan, RN Date/Time Lamount Cohen Time): 01/28/2022 9:17:00 AM ?Triage outcome upgraded to be seen within 24 hours d/t h?o increased sensitivity to bites and  extent of redness ?and swelling ?Referrals ?REFERRED TO PCP OFFICE ?REFERRED TO PCP OFFIC ?

## 2022-01-28 NOTE — Patient Instructions (Addendum)
Continue allegra  ?Ice  ?Elevate  ? ?Keep clean soap and water  ? ?Cortisone cream  ? ?Take prednisone as directed (hyper and hungry)  ? ?Update if not starting to improve in a week or if worsening   ?Esp if pain/ache or worse redness that streaks  ? ? ? ?

## 2022-01-28 NOTE — Assessment & Plan Note (Signed)
Bite on L index finger yesterday/outdoors ?Now swollen/red/itching (improved swelling from yesterday) ?Reassuring exam ?Doubt infection because reaction was immediate ?inst to keep clean/ use ice and topical cortisone ?Prednisone taper px (rev side eff) ?Update if not starting to improve in a week or if worsening   ?Watch for ache/pain as well as fever ?

## 2022-01-28 NOTE — Telephone Encounter (Signed)
I spoke with pt and she is presently on a field trip and thinks she was bitten by insect on lt index finger on 01/27/22; pt said last night the lt hand was swollen with redness. Today the hand is not as swollen or red but still some swelling and redness and pt just wants eval. No difficulty breathing and no swelling in mouth, tongue, throat or neck and pt scheduled appt with Dr Milinda Antis 01/28/22 at 12:30 with UC & ED precautions.([place] CMA oked changing VV to in office). Pt voiced understanding.sending note to Dr Milinda Antis and [place] CMA. ?

## 2022-05-04 ENCOUNTER — Ambulatory Visit: Payer: BC Managed Care – PPO | Admitting: Family Medicine

## 2022-05-04 ENCOUNTER — Encounter: Payer: Self-pay | Admitting: Family Medicine

## 2022-05-04 VITALS — BP 116/80 | HR 59 | Temp 96.7°F | Ht 62.75 in | Wt 129.0 lb

## 2022-05-04 DIAGNOSIS — J0100 Acute maxillary sinusitis, unspecified: Secondary | ICD-10-CM

## 2022-05-04 DIAGNOSIS — J301 Allergic rhinitis due to pollen: Secondary | ICD-10-CM

## 2022-05-04 MED ORDER — PREDNISONE 10 MG PO TABS: ORAL_TABLET | ORAL | 0 refills | Status: DC

## 2022-05-04 MED ORDER — DOXYCYCLINE HYCLATE 100 MG PO TABS
100.0000 mg | ORAL_TABLET | Freq: Two times a day (BID) | ORAL | 0 refills | Status: DC
Start: 2022-05-04 — End: 2022-08-06

## 2022-05-04 NOTE — Assessment & Plan Note (Signed)
Continue flonase and allegra   Prednisone was px for sinusitis

## 2022-05-04 NOTE — Progress Notes (Signed)
Subjective:    Patient ID: Ruth Gill, female    DOB: June 10, 1961, 61 y.o.   MRN: [id number]  HPI Pt presents for sinus symptoms   Wt Readings from Last 3 Encounters:  05/04/22 129 lb (58.5 kg)  01/28/22 130 lb 2 oz (59 kg)  12/03/21 128 lb (58.1 kg)   23.03 kg/m   Started 1 mo ago  Ear falls full / right one -this is not uncommon  Congestion- cannot get much out (feels congestion up high, some post nasal drip) Sounds nasal/ worse on the right side  Puffy and sore over R cheek at times / this comes and goes   Yellow mucous when she can get it out    No ST No cough  No fever    Using nasal spray - flonase- using it this week twice daily   Allegra -daily   Allergies are year around Really bad this past spring   Patient Active Problem List   Diagnosis Date Noted   Insect bite of finger 01/28/2022   Hyperlipidemia 12/03/2021   Acute sinusitis 09/24/2021   Iron deficiency 05/26/2021   Fatigue 04/15/2021   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 01/22/2020   Bilateral hand pain 01/12/2020   Hypothyroid 04/13/2014   Colon cancer screening 01/12/2014   Routine general medical examination at a health care facility 01/04/2014   Allergic rhinitis 12/22/2007   Past Medical History:  Diagnosis Date   Allergic rhinitis, cause unspecified    Allergy    Anemia, unspecified    Heart murmur    MVP   Mastodynia    Thyroid disease    hypothyroid   Past Surgical History:  Procedure Laterality Date   ABDOMINAL HYSTERECTOMY     MOUTH SURGERY     Social History   Tobacco Use   Smoking status: Never   Smokeless tobacco: Never  Substance Use Topics   Alcohol use: No    Alcohol/week: 0.0 standard drinks of alcohol   Drug use: No   Family History  Problem Relation Age of Onset   Colon cancer Neg Hx    Colon polyps Neg Hx    Esophageal cancer Neg Hx    Rectal cancer Neg Hx    Stomach cancer Neg Hx    Allergies  Allergen  Reactions   Azithromycin     REACTION: joint swelling, stiffness   Mobic [Meloxicam] Hives    Looks like sun burn   Penicillins     REACTION: SOB, rash   Current Outpatient Medications on File Prior to Visit  Medication Sig Dispense Refill   Biotin 5000 MCG CAPS Take 1 capsule by mouth daily.     Cholecalciferol (VITAMIN D) 2000 UNITS tablet Take 4,000 Units by mouth daily.      diclofenac (VOLTAREN) 75 MG EC tablet Take 1 tablet by mouth 2 (two) times daily.     estradiol (ESTRACE) 1 MG tablet Take 1 tablet by mouth daily.     fexofenadine (ALLEGRA) 180 MG tablet Take 180 mg by mouth daily as needed.     fluticasone (FLONASE) 50 MCG/ACT nasal spray Place 2 sprays into both nostrils daily as needed. 16 g 11   iron polysaccharides (NIFEREX) 150 MG capsule TAKE 1 CAPSULE (150 MG TOTAL) BY MOUTH EVERY OTHER DAY. 45 capsule 1   levothyroxine (SYNTHROID) 88 MCG tablet Take 1 tablet (88 mcg total) by mouth daily. 90 tablet 3   meclizine (ANTIVERT) 12.5 MG tablet Take 1 tablet (  12.5 mg total) by mouth 3 (three) times daily as needed for dizziness. 15 tablet 0   Multiple Vitamins-Minerals (WOMENS MULTI GUMMIES) CHEW Chew 1 tablet by mouth daily.     Omega-3 Fatty Acids (FISH OIL) 1200 MG CAPS Take 1 capsule by mouth daily.     No current facility-administered medications on file prior to visit.    Review of Systems  Constitutional:  Positive for appetite change. Negative for fatigue and fever.  HENT:  Positive for congestion, ear pain, postnasal drip, rhinorrhea and sinus pressure. Negative for ear discharge, nosebleeds and sore throat.        Ear pressure  Eyes:  Negative for pain, redness and itching.  Respiratory:  Negative for cough, shortness of breath and wheezing.   Cardiovascular:  Negative for chest pain.  Gastrointestinal:  Negative for abdominal pain, diarrhea, nausea and vomiting.  Endocrine: Negative for polyuria.  Genitourinary:  Negative for dysuria, frequency and urgency.   Musculoskeletal:  Negative for arthralgias and myalgias.  Allergic/Immunologic: Negative for immunocompromised state.  Neurological:  Positive for headaches. Negative for dizziness, tremors, syncope, weakness and numbness.  Hematological:  Negative for adenopathy. Does not bruise/bleed easily.  Psychiatric/Behavioral:  Negative for dysphoric mood. The patient is not nervous/anxious.        Objective:   Physical Exam Constitutional:      General: She is not in acute distress.    Appearance: Normal appearance. She is well-developed and normal weight. She is not ill-appearing.  HENT:     Head: Normocephalic and atraumatic.     Comments: Right maxillary sinus tenderness    Right Ear: Tympanic membrane and external ear normal.     Left Ear: Tympanic membrane and external ear normal.     Ears:     Comments: Dull TMs bilat  Scant cerumen R canal    Nose: Congestion and rhinorrhea present.     Mouth/Throat:     Pharynx: Oropharynx is clear. No oropharyngeal exudate or posterior oropharyngeal erythema.     Comments: Clear pnd Eyes:     General:        Right eye: No discharge.        Left eye: No discharge.     Conjunctiva/sclera: Conjunctivae normal.     Pupils: Pupils are equal, round, and reactive to light.  Neck:     Vascular: No carotid bruit.  Cardiovascular:     Rate and Rhythm: Regular rhythm. Bradycardia present.  Pulmonary:     Effort: Pulmonary effort is normal. No respiratory distress.     Breath sounds: Normal breath sounds. No wheezing or rales.     Comments: Good air exch No rales or rhonchi Musculoskeletal:     Cervical back: Normal range of motion and neck supple.  Lymphadenopathy:     Cervical: No cervical adenopathy.  Skin:    General: Skin is warm and dry.     Findings: No rash.  Neurological:     Mental Status: She is alert.     Cranial Nerves: No cranial nerve deficit.     Coordination: Coordination normal.  Psychiatric:        Mood and Affect: Mood  normal.           Assessment & Plan:   Problem List Items Addressed This Visit       Respiratory   Acute sinusitis - Primary    S/p 1 mo of allergy congestion on R side  Sinus pressure/pain  Px doxycycline for 10 d  Prednisone 20 mg taper for congestion /rev side eff Update if not starting to improve in a week or if worsening  Handout given   Meds ordered this encounter  Medications   doxycycline (VIBRA-TABS) 100 MG tablet    Sig: Take 1 tablet (100 mg total) by mouth 2 (two) times daily.    Dispense:  20 tablet    Refill:  0   predniSONE (DELTASONE) 10 MG tablet    Sig: Take 2 pills once daily by mouth for 5 days then 1 pill once daily for 3 days then stop    Dispense:  13 tablet    Refill:  0         Relevant Medications   doxycycline (VIBRA-TABS) 100 MG tablet   predniSONE (DELTASONE) 10 MG tablet   Allergic rhinitis    Continue flonase and allegra   Prednisone was px for sinusitis

## 2022-05-04 NOTE — Patient Instructions (Signed)
Drink lots of fluids Take prednisone as directed- it may make you feel hyper and hungry   Take the doxycycline If not improved let us know   Update if not starting to improve in a week or if worsening

## 2022-05-04 NOTE — Assessment & Plan Note (Signed)
S/p 1 mo of allergy congestion on R side  Sinus pressure/pain  Px doxycycline for 10 d  Prednisone 20 mg taper for congestion /rev side eff Update if not starting to improve in a week or if worsening  Handout given   Meds ordered this encounter  Medications  . doxycycline (VIBRA-TABS) 100 MG tablet    Sig: Take 1 tablet (100 mg total) by mouth 2 (two) times daily.    Dispense:  20 tablet    Refill:  0  . predniSONE (DELTASONE) 10 MG tablet    Sig: Take 2 pills once daily by mouth for 5 days then 1 pill once daily for 3 days then stop    Dispense:  13 tablet    Refill:  0

## 2022-05-13 ENCOUNTER — Telehealth: Payer: Self-pay | Admitting: Family Medicine

## 2022-05-13 NOTE — Telephone Encounter (Signed)
Please advise 

## 2022-05-13 NOTE — Telephone Encounter (Signed)
Called and spoke with pt she said that she is having clear runny nose. No congestion, tickling in throat no sore , watery eye , no fever or sinus / facial pain.

## 2022-05-13 NOTE — Telephone Encounter (Signed)
Patient called in and stated that was prescribed prednisone and an antibiotic. She still has about two days left of the antibiotic, and have finished the prednisone. She stated that she has been experiencing runny nose, tickle in her throat and her eye is watering every since she finished the prednisone. She was wondering if she will need another round of antibiotics and prednisone. Please advise. Thank you!

## 2022-05-13 NOTE — Telephone Encounter (Signed)
Any congestion ?  Is mucous from runny nose clear or does it have color? Any facial pain /sinus pain?  Any fever?

## 2022-05-13 NOTE — Telephone Encounter (Signed)
Called and notified pt and when over recommendation w/ pt , pt understood, she said that she will continue using nasal and allergy mediation

## 2022-05-13 NOTE — Telephone Encounter (Signed)
That is reassuring and sounds more like allergies.  (The prednisone helps allergy symptoms but cannot be given long term)  Ragweed and mold are getting high here now  I recommend she change her antihistamine from allegra to zyrtec 10 mg or xyzal   Continue flonase or other steroid nasal spray every day  Try to avoid allergens/triggers  Alert [place] if sinus pain or pressure return  Also if symptoms do not improve

## 2022-08-06 ENCOUNTER — Ambulatory Visit: Payer: BC Managed Care – PPO | Admitting: Family Medicine

## 2022-08-06 ENCOUNTER — Encounter: Payer: Self-pay | Admitting: Family Medicine

## 2022-08-06 VITALS — BP 143/75 | HR 62 | Temp 97.9°F | Ht 62.75 in | Wt 129.1 lb

## 2022-08-06 DIAGNOSIS — H938X9 Other specified disorders of ear, unspecified ear: Secondary | ICD-10-CM | POA: Insufficient documentation

## 2022-08-06 DIAGNOSIS — H938X1 Other specified disorders of right ear: Secondary | ICD-10-CM

## 2022-08-06 DIAGNOSIS — R030 Elevated blood-pressure reading, without diagnosis of hypertension: Secondary | ICD-10-CM

## 2022-08-06 NOTE — Progress Notes (Signed)
Subjective:    Patient ID: Ruth Gill, female    DOB: 10-01-1960, 61 y.o.   MRN: [id number]  HPI Pt presents with ear fullness and dizzy  Wt Readings from Last 3 Encounters:  08/06/22 129 lb 2 oz (58.6 kg)  05/04/22 129 lb (58.5 kg)  01/28/22 130 lb 2 oz (59 kg)   23.06 kg/m  R ear keeps popping  No pain  A little dec in hearing  Notes with talking/ drinking  For 2 months  Briefly had soreness behind ear  Occ headaches Light headed at times   Using flonase daily  Allegra daily  Takes dlclofenac bid for thumb arthritis (shots are not working and she may plan surgery)  Occ tylenol     Did get peppermint oil near eye on L side this week-irritated her nostril Also used an air freshener that bothered her    Bp was up at home   BP Readings from Last 3 Encounters:  08/06/22 (!) 148/92  05/04/22 116/80  01/28/22 124/70   Pulse Readings from Last 3 Encounters:  08/06/22 62  05/04/22 (!) 59  01/28/22 62   Per pt Dr Council Mechanic put her on bp medicine years ago and she got off of it with lifestyle change  No fam h/o HTN   Lab Results  Component Value Date   CREATININE 0.91 11/26/2021   BUN 19 11/26/2021   NA 139 11/26/2021   K 4.7 11/26/2021   CL 102 11/26/2021   CO2 33 (H) 11/26/2021   Lab Results  Component Value Date   WBC 6.0 11/26/2021   HGB 13.1 11/26/2021   HCT 39.3 11/26/2021   MCV 96.6 11/26/2021   PLT 211.0 11/26/2021   Patient Active Problem List   Diagnosis Date Noted   Ear fullness 08/06/2022   Insect bite of finger 01/28/2022   Hyperlipidemia 12/03/2021   Acute sinusitis 09/24/2021   Iron deficiency 05/26/2021   Fatigue 04/15/2021   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 01/22/2020   Bilateral hand pain 01/12/2020   Elevated blood pressure reading 08/04/2016   Hypothyroid 04/13/2014   Colon cancer screening 01/12/2014   Routine general medical examination at a health care facility 01/04/2014    Allergic rhinitis 12/22/2007   Past Medical History:  Diagnosis Date   Allergic rhinitis, cause unspecified    Allergy    Anemia, unspecified    Heart murmur    MVP   Mastodynia    Thyroid disease    hypothyroid   Past Surgical History:  Procedure Laterality Date   ABDOMINAL HYSTERECTOMY     MOUTH SURGERY     Social History   Tobacco Use   Smoking status: Never   Smokeless tobacco: Never  Substance Use Topics   Alcohol use: No    Alcohol/week: 0.0 standard drinks of alcohol   Drug use: No   Family History  Problem Relation Age of Onset   Colon cancer Neg Hx    Colon polyps Neg Hx    Esophageal cancer Neg Hx    Rectal cancer Neg Hx    Stomach cancer Neg Hx    Allergies  Allergen Reactions   Azithromycin     REACTION: joint swelling, stiffness   Mobic [Meloxicam] Hives    Looks like sun burn   Penicillins     REACTION: SOB, rash   Current Outpatient Medications on File Prior to Visit  Medication Sig Dispense Refill   Biotin 5000 MCG CAPS Take 1  capsule by mouth daily.     Cholecalciferol (VITAMIN D) 2000 UNITS tablet Take 4,000 Units by mouth daily.      diclofenac (VOLTAREN) 75 MG EC tablet Take 1 tablet by mouth 2 (two) times daily.     estradiol (ESTRACE) 1 MG tablet Take 1 tablet by mouth daily.     fexofenadine (ALLEGRA) 180 MG tablet Take 180 mg by mouth daily as needed.     fluticasone (FLONASE) 50 MCG/ACT nasal spray Place 2 sprays into both nostrils daily as needed. 16 g 11   iron polysaccharides (NIFEREX) 150 MG capsule TAKE 1 CAPSULE (150 MG TOTAL) BY MOUTH EVERY OTHER DAY. 45 capsule 1   levothyroxine (SYNTHROID) 88 MCG tablet Take 1 tablet (88 mcg total) by mouth daily. 90 tablet 3   meclizine (ANTIVERT) 12.5 MG tablet Take 1 tablet (12.5 mg total) by mouth 3 (three) times daily as needed for dizziness. 15 tablet 0   Multiple Vitamins-Minerals (WOMENS MULTI GUMMIES) CHEW Chew 1 tablet by mouth daily.     Omega-3 Fatty Acids (FISH OIL) 1200 MG CAPS  Take 1 capsule by mouth daily.     No current facility-administered medications on file prior to visit.      Review of Systems  Constitutional:  Negative for activity change, appetite change, fatigue, fever and unexpected weight change.  HENT:  Positive for postnasal drip and sinus pressure. Negative for congestion, ear discharge, ear pain, facial swelling, rhinorrhea, sinus pain, sore throat, trouble swallowing and voice change.        Ear fullness ? If slt dec hearing on R  Eyes:  Negative for pain, redness and visual disturbance.  Respiratory:  Negative for cough, shortness of breath and wheezing.   Cardiovascular:  Negative for chest pain and palpitations.  Gastrointestinal:  Negative for abdominal pain, blood in stool, constipation and diarrhea.  Endocrine: Negative for polydipsia and polyuria.  Genitourinary:  Negative for dysuria, frequency and urgency.  Musculoskeletal:  Negative for arthralgias, back pain and myalgias.  Skin:  Negative for pallor and rash.  Allergic/Immunologic: Negative for environmental allergies.  Neurological:  Positive for light-headedness and headaches. Negative for dizziness and syncope.  Hematological:  Negative for adenopathy. Does not bruise/bleed easily.  Psychiatric/Behavioral:  Negative for decreased concentration and dysphoric mood. The patient is not nervous/anxious.        Objective:   Physical Exam Constitutional:      General: She is not in acute distress.    Appearance: Normal appearance. She is well-developed and normal weight. She is not ill-appearing or diaphoretic.  HENT:     Head: Normocephalic and atraumatic.     Right Ear: External ear normal. There is no impacted cerumen.     Left Ear: External ear normal. There is no impacted cerumen.     Ears:     Comments: Small eff with L TM R TM is dull   No erythema or bulging     Nose:     Comments: Nares are boggy but clear     Mouth/Throat:     Mouth: Mucous membranes are moist.      Pharynx: No oropharyngeal exudate or posterior oropharyngeal erythema.  Eyes:     General: No scleral icterus.       Right eye: No discharge.        Left eye: No discharge.     Conjunctiva/sclera: Conjunctivae normal.     Pupils: Pupils are equal, round, and reactive to light.  Neck:  Thyroid: No thyromegaly.     Vascular: No carotid bruit or JVD.  Cardiovascular:     Rate and Rhythm: Normal rate and regular rhythm.     Heart sounds: Normal heart sounds.     No gallop.  Pulmonary:     Effort: Pulmonary effort is normal. No respiratory distress.     Breath sounds: Normal breath sounds. No wheezing or rales.  Abdominal:     General: There is no distension or abdominal bruit.     Palpations: Abdomen is soft.  Musculoskeletal:     Cervical back: Normal range of motion and neck supple.     Right lower leg: No edema.     Left lower leg: No edema.  Lymphadenopathy:     Cervical: No cervical adenopathy.  Skin:    General: Skin is warm and dry.     Coloration: Skin is not jaundiced or pale.     Findings: No bruising, erythema or rash.  Neurological:     Mental Status: She is alert.     Cranial Nerves: No cranial nerve deficit.     Sensory: No sensory deficit.     Motor: No weakness.     Coordination: Coordination normal.     Gait: Gait normal.     Deep Tendon Reflexes: Reflexes are normal and symmetric. Reflexes normal.  Psychiatric:        Mood and Affect: Mood normal.           Assessment & Plan:   Problem List Items Addressed This Visit       Other   Ear fullness - Primary    On exam pt has a small effusion in the other ear  ? If she is getting intermittent ETD in setting of on/off nasal symptoms  Inst her to go up on flonase to bid for 7 d then return to daily  Continue allegra  Watch for other symptoms  Then update, if not improved will ref to ENT for further eval   Does have elevated bp today, unsure if related Inst to monitor at home and f/u in 1 mo  for f/u visit        Elevated blood pressure reading    BP: (!) 143/75  Per pt -had this happen before and took med short term before controlling with lifestyle change   Disc proper technique and time to check at home F/u here 1 mo  Overall good lifestyle habits   She does take HRT (estrace) 1 mg daily

## 2022-08-06 NOTE — Patient Instructions (Signed)
Eustachian tube dysfunction /fluid  may be causing your symptoms   Increase your flonase to twice daily for a week then go back to once daily  Continue allegra and other medicines Avoid strong scents   If this is not considerably improved in 7 days - call us and I will do an ENT referral   Check your blood pressure at home when you are relaxed and not after exercise or caffeine  Use an arm cuff and make sure your arm is supported at heart level    Take care of yourself

## 2022-08-06 NOTE — Assessment & Plan Note (Signed)
On exam pt has a small effusion in the other ear  ? If she is getting intermittent ETD in setting of on/off nasal symptoms  Inst her to go up on flonase to bid for 7 d then return to daily  Continue allegra  Watch for other symptoms  Then update, if not improved will ref to ENT for further eval   Does have elevated bp today, unsure if related Inst to monitor at home and f/u in 1 mo for f/u visit

## 2022-08-06 NOTE — Assessment & Plan Note (Signed)
BP: (!) 143/75  Per pt -had this happen before and took med short term before controlling with lifestyle change   Disc proper technique and time to check at home F/u here 1 mo  Overall good lifestyle habits   She does take HRT (estrace) 1 mg daily

## 2022-08-10 ENCOUNTER — Telehealth: Payer: Self-pay | Admitting: Family Medicine

## 2022-08-10 DIAGNOSIS — H938X1 Other specified disorders of right ear: Secondary | ICD-10-CM

## 2022-08-10 NOTE — Addendum Note (Signed)
Addended by: Roxy Manns A on: 08/10/2022 08:14 PM   Modules accepted: Orders

## 2022-08-10 NOTE — Telephone Encounter (Signed)
Patient called in and stated she was seen last Thursday. She stated now she think she may have a sinus infection. She stated that she was instructed to call back if she wasn't feeling any better and a referral for an ENT would be placed. Thank you!

## 2022-08-10 NOTE — Telephone Encounter (Signed)
I placed the referral Please let us know if you don't hear in 1-2 weeks Unsure how long it will get to get in   Message mentioned possible sinus infection  What new symptoms is she having now?  Thanks

## 2022-08-11 NOTE — Telephone Encounter (Signed)
If facial pain worsens and mucous turns green/yellow or fever  please come in for a re check  Thanks for keeping me posted

## 2022-08-11 NOTE — Telephone Encounter (Signed)
Plain mucinex would be better tolerated for congestion (no D)  Nasal saline spray is helpful , also breathing steam   What color is the mucous from her nose/sinuses?  Any cough? Any fever?

## 2022-08-11 NOTE — Telephone Encounter (Signed)
Pt has been informed.  Pt states her sx's are: Nasal drainage, congestion, watery eyes, and the left side of her face and ear are sore.  Pt would like to know what can she take otc to help she states mucinex d makes her feel funny.

## 2022-08-11 NOTE — Telephone Encounter (Signed)
Pt has been informed and pt states her mucous is clear. No cough. No fever.

## 2022-08-11 NOTE — Telephone Encounter (Signed)
Pt has been informed.

## 2022-09-07 ENCOUNTER — Encounter: Payer: Self-pay | Admitting: Family Medicine

## 2022-09-07 ENCOUNTER — Ambulatory Visit: Payer: BC Managed Care – PPO | Admitting: Family Medicine

## 2022-09-07 VITALS — BP 136/84 | HR 64 | Temp 97.3°F | Ht 62.75 in | Wt 130.4 lb

## 2022-09-07 DIAGNOSIS — R030 Elevated blood-pressure reading, without diagnosis of hypertension: Secondary | ICD-10-CM | POA: Diagnosis not present

## 2022-09-07 DIAGNOSIS — B351 Tinea unguium: Secondary | ICD-10-CM | POA: Diagnosis not present

## 2022-09-07 DIAGNOSIS — M182 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: Secondary | ICD-10-CM

## 2022-09-07 DIAGNOSIS — Z23 Encounter for immunization: Secondary | ICD-10-CM

## 2022-09-07 NOTE — Addendum Note (Signed)
Addended by: Shon Millet on: 09/07/2022 08:32 AM   Modules accepted: Orders
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
## 2022-09-07 NOTE — Assessment & Plan Note (Signed)
Taking voltaren Considering surg for R first MCP next year

## 2022-09-07 NOTE — Assessment & Plan Note (Signed)
Left great toe nail is thick/yellow and crumbly  Suspect fungal nail  She is using otc product since summer (? 2 mm of imp at base)   Referral done to podiatry to disc dx and tx options May need nail debridement at well  Enc to use athlete's foot product if applicable

## 2022-09-07 NOTE — Assessment & Plan Note (Signed)
This is improved  Her cuff at home may not be accurate - she is using it correctly  Enc to bring in in the future to test  BP: 136/84   Enc to eat less high sodium foods Aware the voltaren (for hand oa) may affect  Also takes oral estrace

## 2022-09-07 NOTE — Patient Instructions (Addendum)
Your blood pressure looks better today  Continue to monitor     I will place a referral to podiatry in [facility] for possible nail fungus  If you don't get a call in 1-2 weeks let us know   Consider using a product for athlete's foot chronically as well

## 2022-09-09 ENCOUNTER — Ambulatory Visit: Payer: BC Managed Care – PPO | Admitting: Podiatry

## 2022-09-09 DIAGNOSIS — B351 Tinea unguium: Secondary | ICD-10-CM

## 2022-09-09 MED ORDER — TERBINAFINE HCL 250 MG PO TABS: 250.0000 mg | ORAL_TABLET | Freq: Every day | ORAL | 0 refills | Status: DC

## 2022-09-09 NOTE — Progress Notes (Signed)
  Subjective:  Patient ID: Ruth Gill, female    DOB: February 28, 1961,  MRN: [id number]  Chief Complaint  Patient presents with   Nail Problem    np left great toenail Onychomycosis/ has tried over the counter medication all summer    61 y.o. female presents with the above complaint. History confirmed with patient.   Objective:  Physical Exam: warm, good capillary refill, no trophic changes or ulcerative lesions, normal DP and PT pulses, normal sensory exam, and left hallux onychomycosis.     Assessment:   1. Onychomycosis      Plan:  Patient was evaluated and treated and all questions answered.  We discussed etiology and treatment options of onychomycosis in detail including oral topical and laser therapy.  Discussed pros and cons of each.  I recommended oral therapy with Lamisil she has no contraindications for this.  We discussed possible side effects.  Rx 90-day course sent to pharmacy I will see her back in 4 months for follow-up and reevaluation.  photographs taken  Return in about 4 months (around 01/09/2023) for follow up after nail fungus treatment.

## 2022-09-22 ENCOUNTER — Other Ambulatory Visit: Payer: Self-pay | Admitting: Family Medicine

## 2022-10-19 ENCOUNTER — Ambulatory Visit: Payer: BC Managed Care – PPO | Admitting: Family Medicine

## 2022-10-19 ENCOUNTER — Encounter: Payer: Self-pay | Admitting: Family Medicine

## 2022-10-19 VITALS — BP 146/92 | HR 80 | Temp 97.3°F | Ht 62.75 in | Wt 132.5 lb

## 2022-10-19 DIAGNOSIS — H938X1 Other specified disorders of right ear: Secondary | ICD-10-CM | POA: Diagnosis not present

## 2022-10-19 DIAGNOSIS — J029 Acute pharyngitis, unspecified: Secondary | ICD-10-CM | POA: Diagnosis not present

## 2022-10-19 DIAGNOSIS — J020 Streptococcal pharyngitis: Secondary | ICD-10-CM | POA: Insufficient documentation

## 2022-10-19 LAB — POCT RAPID STREP A (OFFICE): Rapid Strep A Screen: POSITIVE — AB

## 2022-10-19 MED ORDER — CLINDAMYCIN HCL 300 MG PO CAPS: 300.0000 mg | ORAL_CAPSULE | Freq: Three times a day (TID) | ORAL | 0 refills | Status: DC

## 2022-10-19 NOTE — Progress Notes (Signed)
Subjective:    Patient ID: Ruth Gill, female    DOB: October 27, 1960, 62 y.o.   MRN: [id number]  HPI Pt presents for ST   Wt Readings from Last 3 Encounters:  10/19/22 132 lb 8 oz (60.1 kg)  09/07/22 130 lb 6 oz (59.1 kg)  08/06/22 129 lb 2 oz (58.6 kg)   23.66 kg/m  Vitals:   10/19/22 1121  BP: (!) 146/92  Pulse: 80  Temp: (!) 97.3 F (36.3 C)  SpO2: 100%    Sat night-tickle in throat  Then lost her voice / worse yesterday   Throat feels dry more than sore   Feels like mucous in throat   No cough until this am  Coughed up a little mucous No sneezing or congestion  No wheezing No sob   4 funerals in 2 weeks- exp to a lot of people   No fever No body aches   Results for orders placed or performed in visit on 10/19/22  Rapid Strep A  Result Value Ref Range   Rapid Strep A Screen Positive (A) Negative     Otc Salt water gargle Lozenges  Water /drinking    Has a px for azelastine nasal spray 0.1% from ENT Told to use this instead of allegra when she has a flare up   Uses flonase regularly  Ear still pops Per pt was not diagnosed with anything   Results for orders placed or performed in visit on 10/19/22  Rapid Strep A  Result Value Ref Range   Rapid Strep A Screen Positive (A) Negative    Patient Active Problem List   Diagnosis Date Noted   Sore throat 10/19/2022   Strep throat 10/19/2022   Onychomycosis 09/07/2022   Ear fullness 08/06/2022   Hyperlipidemia 12/03/2021   Iron deficiency 05/26/2021   Fatigue 04/15/2021   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 01/22/2020   Bilateral hand pain 01/12/2020   Elevated blood pressure reading 08/04/2016   Hypothyroid 04/13/2014   Colon cancer screening 01/12/2014   Routine general medical examination at a health care facility 01/04/2014   Allergic rhinitis 12/22/2007   Past Medical History:  Diagnosis Date   Allergic rhinitis, cause unspecified    Allergy     Anemia, unspecified    Heart murmur    MVP   Mastodynia    Thyroid disease    hypothyroid   Past Surgical History:  Procedure Laterality Date   ABDOMINAL HYSTERECTOMY     MOUTH SURGERY     Social History   Tobacco Use   Smoking status: Never   Smokeless tobacco: Never  Substance Use Topics   Alcohol use: No    Alcohol/week: 0.0 standard drinks of alcohol   Drug use: No   Family History  Problem Relation Age of Onset   Colon cancer Neg Hx    Colon polyps Neg Hx    Esophageal cancer Neg Hx    Rectal cancer Neg Hx    Stomach cancer Neg Hx    Allergies  Allergen Reactions   Azithromycin     REACTION: joint swelling, stiffness   Mobic [Meloxicam] Hives    Looks like sun burn   Penicillins     REACTION: SOB, rash   Current Outpatient Medications on File Prior to Visit  Medication Sig Dispense Refill   Biotin 5000 MCG CAPS Take 1 capsule by mouth daily.     Cholecalciferol (VITAMIN D) 2000 UNITS tablet Take 4,000 Units by mouth  daily.      diclofenac (VOLTAREN) 75 MG EC tablet Take 1 tablet by mouth 2 (two) times daily.     estradiol (ESTRACE) 1 MG tablet Take 1 tablet by mouth daily.     fexofenadine (ALLEGRA) 180 MG tablet Take 180 mg by mouth daily as needed.     fluticasone (FLONASE) 50 MCG/ACT nasal spray Place 2 sprays into both nostrils daily as needed. 16 g 11   iron polysaccharides (NIFEREX) 150 MG capsule TAKE 1 CAPSULE (150 MG TOTAL) BY MOUTH EVERY OTHER DAY 45 capsule 0   levothyroxine (SYNTHROID) 88 MCG tablet Take 1 tablet (88 mcg total) by mouth daily. 90 tablet 3   meclizine (ANTIVERT) 12.5 MG tablet Take 1 tablet (12.5 mg total) by mouth 3 (three) times daily as needed for dizziness. 15 tablet 0   Multiple Vitamins-Minerals (WOMENS MULTI GUMMIES) CHEW Chew 2 tablets by mouth daily.     Omega-3 Fatty Acids (FISH OIL) 1200 MG CAPS Take 1 capsule by mouth daily.     VITAMIN D PO Take 4,000 Units by mouth daily.     azelastine (ASTELIN) 0.1 % nasal spray  Place 1-2 sprays into both nostrils 2 (two) times daily as needed for rhinitis. (Patient not taking: Reported on 10/19/2022)     No current facility-administered medications on file prior to visit.     Review of Systems  Constitutional:  Negative for activity change, appetite change, fatigue, fever and unexpected weight change.  HENT:  Positive for postnasal drip and sore throat. Negative for congestion, ear pain, rhinorrhea and sinus pressure.   Eyes:  Negative for pain, redness and visual disturbance.  Respiratory:  Negative for cough, shortness of breath and wheezing.   Cardiovascular:  Negative for chest pain and palpitations.  Gastrointestinal:  Negative for abdominal pain, blood in stool, constipation and diarrhea.  Endocrine: Negative for polydipsia and polyuria.  Genitourinary:  Negative for dysuria, frequency and urgency.  Musculoskeletal:  Negative for arthralgias, back pain and myalgias.  Skin:  Negative for pallor and rash.  Allergic/Immunologic: Negative for environmental allergies.  Neurological:  Negative for dizziness, syncope and headaches.  Hematological:  Negative for adenopathy. Does not bruise/bleed easily.  Psychiatric/Behavioral:  Negative for decreased concentration and dysphoric mood. The patient is not nervous/anxious.        Objective:   Physical Exam Constitutional:      General: She is not in acute distress.    Appearance: She is well-developed and normal weight. She is not ill-appearing or diaphoretic.  HENT:     Head: Normocephalic and atraumatic.     Right Ear: Tympanic membrane and ear canal normal.     Left Ear: Tympanic membrane and ear canal normal.     Nose: No congestion or rhinorrhea.     Comments: Boggy nares    Mouth/Throat:     Mouth: Mucous membranes are moist. No oral lesions.     Pharynx: Uvula midline. Posterior oropharyngeal erythema present. No pharyngeal swelling, oropharyngeal exudate or uvula swelling.     Tonsils: No tonsillar  exudate.  Eyes:     Conjunctiva/sclera: Conjunctivae normal.     Pupils: Pupils are equal, round, and reactive to light.  Cardiovascular:     Rate and Rhythm: Normal rate and regular rhythm.     Heart sounds: No murmur heard. Pulmonary:     Effort: Pulmonary effort is normal. No respiratory distress.     Breath sounds: No stridor. No wheezing, rhonchi or rales.  Musculoskeletal:  Cervical back: Neck supple.  Lymphadenopathy:     Cervical: No cervical adenopathy.  Skin:    General: Skin is warm and dry.     Findings: No rash.  Neurological:     Mental Status: She is alert.  Psychiatric:        Mood and Affect: Mood normal.           Assessment & Plan:   Problem List Items Addressed This Visit       Respiratory   Strep throat - Primary    Relatively mild throat symptoms for several days  Pos strep test   Pt is allergic to pcn and intol of azithromycin Px clindamycin 300 mg tid with food  Disc symptom care Er precautions noted  Will drink fluids/salt water gargle Update if not starting to improve in a week or if worsening    With pnd may also be getting a cold- watching this        Relevant Medications   clindamycin (CLEOCIN) 300 MG capsule     Other   Ear fullness    Per pt saw ENT and had nl exam No diagnosis  Was px astelin ns as alt to allegra Still uses flonase  Symptoms are not overly bothersome fortunately  Inst her to keep [place] posted       Sore throat   Relevant Orders   Rapid Strep A (Completed)

## 2022-10-19 NOTE — Patient Instructions (Signed)
Your strep test is positive   Take clindamycin as directed for 7 days If symptoms worsen or you don't improve let us know   Possible early cold as well in light of the post nasal drip  Treat symptoms as needed  Salt water gargle  Drink lots of fluids Tylenol is ok for pain or fever   Update if not starting to improve in a week or if worsening

## 2022-10-19 NOTE — Assessment & Plan Note (Signed)
Relatively mild throat symptoms for several days  Pos strep test   Pt is allergic to pcn and intol of azithromycin Px clindamycin 300 mg tid with food  Disc symptom care Er precautions noted  Will drink fluids/salt water gargle Update if not starting to improve in a week or if worsening    With pnd may also be getting a cold- watching this

## 2022-10-19 NOTE — Assessment & Plan Note (Signed)
Per pt saw ENT and had nl exam No diagnosis  Was px astelin ns as alt to allegra Still uses flonase  Symptoms are not overly bothersome fortunately  Inst her to keep [place] posted

## 2022-10-29 DIAGNOSIS — B351 Tinea unguium: Secondary | ICD-10-CM

## 2022-10-29 HISTORY — DX: Tinea unguium: B35.1

## 2022-11-09 ENCOUNTER — Telehealth: Payer: Self-pay | Admitting: *Deleted

## 2022-11-09 NOTE — Telephone Encounter (Signed)
Spoike with patient and she is willing to try the fluconazole, please send to pharmacy on file

## 2022-11-09 NOTE — Telephone Encounter (Signed)
Patient is calling because medication for nails (terbinafine)prescribed is causing upset stomach, is there something else that she can take? Please advise.

## 2022-11-10 MED ORDER — FLUCONAZOLE 150 MG PO TABS: 150.0000 mg | ORAL_TABLET | ORAL | 0 refills | Status: DC

## 2022-11-19 ENCOUNTER — Encounter
Admission: RE | Admit: 2022-11-19 | Discharge: 2022-11-19 | Disposition: A | Discharge status: Home / Self Care | Payer: BC Managed Care – PPO | Source: Ambulatory Visit | Attending: Specialist | Admitting: Specialist

## 2022-11-19 DIAGNOSIS — R011 Cardiac murmur, unspecified: Secondary | ICD-10-CM | POA: Diagnosis not present

## 2022-11-19 DIAGNOSIS — D649 Anemia, unspecified: Secondary | ICD-10-CM | POA: Diagnosis not present

## 2022-11-19 DIAGNOSIS — E785 Hyperlipidemia, unspecified: Secondary | ICD-10-CM | POA: Diagnosis not present

## 2022-11-19 DIAGNOSIS — Z01818 Encounter for other preprocedural examination: Secondary | ICD-10-CM | POA: Diagnosis present

## 2022-11-19 DIAGNOSIS — I341 Nonrheumatic mitral (valve) prolapse: Secondary | ICD-10-CM | POA: Diagnosis not present

## 2022-11-19 DIAGNOSIS — E039 Hypothyroidism, unspecified: Secondary | ICD-10-CM | POA: Insufficient documentation

## 2022-11-19 DIAGNOSIS — Z01810 Encounter for preprocedural cardiovascular examination: Secondary | ICD-10-CM

## 2022-11-19 DIAGNOSIS — Z01812 Encounter for preprocedural laboratory examination: Secondary | ICD-10-CM

## 2022-11-19 HISTORY — DX: Hyperlipidemia, unspecified: E78.5

## 2022-11-19 HISTORY — DX: Hypothyroidism, unspecified: E03.9

## 2022-11-19 HISTORY — DX: Nonrheumatic mitral (valve) prolapse: I34.1

## 2022-11-19 LAB — BASIC METABOLIC PANEL
Anion gap: 10 (ref 5–15)
BUN: 24 mg/dL — ABNORMAL HIGH (ref 8–23)
CO2: 27 mmol/L (ref 22–32)
Calcium: 9.4 mg/dL (ref 8.9–10.3)
Chloride: 102 mmol/L (ref 98–111)
Creatinine, Ser: 0.78 mg/dL (ref 0.44–1.00)
GFR, Estimated: >60 mL/min (ref >60)
Glucose, Bld: 111 mg/dL — ABNORMAL HIGH (ref 70–99)
Potassium: 3.8 mmol/L (ref 3.5–5.1)
Sodium: 139 mmol/L (ref 135–145)

## 2022-11-19 LAB — CBC
HCT: 37.4 % (ref 36.0–46.0)
Hemoglobin: 12.6 g/dL (ref 12.0–15.0)
MCH: 31.9 pg (ref 26.0–34.0)
MCHC: 33.7 g/dL (ref 30.0–36.0)
MCV: 94.7 fL (ref 80.0–100.0)
Platelets: 186 10*3/uL (ref 150–400)
RBC: 3.95 MIL/uL (ref 3.87–5.11)
RDW: 11.4 % — ABNORMAL LOW (ref 11.5–15.5)
WBC: 6.3 10*3/uL (ref 4.0–10.5)
nRBC: 0 % (ref 0.0–0.2)

## 2022-11-19 NOTE — Patient Instructions (Signed)
Your procedure is scheduled on:11-26-22 Thursday Report to the Registration Desk on the 1st floor of the [facility].Then proceed to the 2nd floor Surgery Desk To find out your arrival time, please call [phone] between 1PM - 3PM on:11-25-22 Wednesday If your arrival time is 6:00 am, do not arrive before that time as the [facility] entrance doors do not open until 6:00 am.  REMEMBER: Instructions that are not followed completely may result in serious medical risk, up to and including death; or upon the discretion of your surgeon and anesthesiologist your surgery may need to be rescheduled.  Do not eat food after midnight the night before surgery.  No gum chewing or hard candies.  You may however, drink CLEAR liquids up to 2 hours before you are scheduled to arrive for your surgery. Do not drink anything within 2 hours of your scheduled arrival time.  Clear liquids include: - water  - apple juice without pulp - gatorade (not RED colors) - black coffee or tea (Do NOT add milk or creamers to the coffee or tea) Do NOT drink anything that is not on this list  One week prior to surgery:Last dose will be 11-19-22 Stop Anti-inflammatories (NSAIDS) such as diclofenac (VOLTAREN) Advil, Aleve, Ibuprofen, Motrin, Naproxen, Naprosyn and Aspirin based products such as Excedrin, Goody's Powder, BC Powder.You may however, continue to take Tylenol if needed for pain up until the day of surgery.  Stop ANY OVER THE COUNTER supplements/vitamins NOW (11-19-22) until after surgery (Biotin, Vitamin D, Multivitamin and Fish Oil)-Continue your Iron pill   TAKE ONLY THESE MEDICATIONS THE MORNING OF SURGERY WITH A SIP OF WATER: -fexofenadine (ALLEGRA)  -levothyroxine (SYNTHROID)   No Alcohol for 24 hours before or after surgery.  No Smoking including e-cigarettes for 24 hours before surgery.  No chewable tobacco products for at least 6 hours before surgery.  No nicotine patches on the day of  surgery.  Do not use any "recreational" drugs for at least a week (preferably 2 weeks) before your surgery.  Please be advised that the combination of cocaine and anesthesia may have negative outcomes, up to and including death. If you test positive for cocaine, your surgery will be cancelled.  On the morning of surgery brush your teeth with toothpaste and water, you may rinse your mouth with mouthwash if you wish. Do not swallow any toothpaste or mouthwash.  Use CHG Soap as directed on instruction sheet.  Do not wear jewelry, make-up, hairpins, clips or nail polish.  Do not wear lotions, powders, or perfumes.   Do not shave body hair from the neck down 48 hours before surgery.  Contact lenses, hearing aids and dentures may not be worn into surgery.  Do not bring valuables to the hospital. [facility] is not responsible for any missing/lost belongings or valuables.   Notify your doctor if there is any change in your medical condition (cold, fever, infection).  Wear comfortable clothing (specific to your surgery type) to the hospital.  After surgery, you can help prevent lung complications by doing breathing exercises.  Take deep breaths and cough every 1-2 hours. Your doctor may order a device called an Incentive Spirometer to help you take deep breaths. When coughing or sneezing, hold a pillow firmly against your incision with both hands. This is called "splinting." Doing this helps protect your incision. It also decreases belly discomfort.  If you are being admitted to the hospital overnight, leave your suitcase in the car. After surgery it may  be brought to your room.  In case of increased patient census, it may be necessary for you, the patient, to continue your postoperative care in the Same Day Surgery department.  If you are being discharged the day of surgery, you will not be allowed to drive home. You will need a responsible individual to drive you home and stay with you  for 24 hours after surgery.   If you are taking public transportation, you will need to have a responsible individual with you.  Please call the [facility] Dept. at [phone] if you have any questions about these instructions.  Surgery Visitation Policy:  Patients undergoing a surgery or procedure may have two family members or support persons with them as long as the person is not COVID-19 positive or experiencing its symptoms.   Due to an increase in RSV and influenza rates and associated hospitalizations, children ages 66 and under will not be able to visit patients in [facility]. Masks continue to be strongly recommended.     Preparing for Surgery with CHLORHEXIDINE GLUCONATE (CHG) Soap  Chlorhexidine Gluconate (CHG) Soap  o An antiseptic cleaner that kills germs and bonds with the skin to continue killing germs even after washing  o Used for showering the night before surgery and morning of surgery  Before surgery, you can play an important role by reducing the number of germs on your skin.  CHG (Chlorhexidine gluconate) soap is an antiseptic cleanser which kills germs and bonds with the skin to continue killing germs even after washing.  Please do not use if you have an allergy to CHG or antibacterial soaps. If your skin becomes reddened/irritated stop using the CHG.  1. Shower the NIGHT BEFORE SURGERY and the MORNING OF SURGERY with CHG soap.  2. If you choose to wash your hair, wash your hair first as usual with your normal shampoo.  3. After shampooing, rinse your hair and body thoroughly to remove the shampoo.  4. Use CHG as you would any other liquid soap. You can apply CHG directly to the skin and wash gently with a scrungie or a clean washcloth.  5. Apply the CHG soap to your body only from the neck down. Do not use on open wounds or open sores. Avoid contact with your eyes, ears, mouth, and genitals (private parts). Wash face and genitals  (private parts) with your normal soap.  6. Wash thoroughly, paying special attention to the area where your surgery will be performed.  7. Thoroughly rinse your body with warm water.  8. Do not shower/wash with your normal soap after using and rinsing off the CHG soap.  9. Pat yourself dry with a clean towel.  10. Wear clean pajamas to bed the night before surgery.  12. Place clean sheets on your bed the night of your first shower and do not sleep with pets.  13. Shower again with the CHG soap on the day of surgery prior to arriving at the hospital.  14. Do not apply any deodorants/lotions/powders.  15. Please wear clean clothes to the hospital.

## 2022-11-20 ENCOUNTER — Other Ambulatory Visit: Payer: Self-pay | Admitting: Specialist

## 2022-11-20 NOTE — H&P (Signed)
PREOPERATIVE H&P  Chief Complaint: Bilateral osteoarthritis of first carpometacarpal joint  HPI: Ruth Gill is a 62 y.o. female who presents for preoperative history and physical with a diagnosis of Bilateral osteoarthritis of first carpometacarpal joint. Symptoms are rated as moderate to severe, and have been worsening.  The right thumb is bothering her the most in which she wished to have surgery on this one.  She has failed conservative treatment treatment including injections rest bracing and medication.  This is significantly impairing activities of daily living.  She has elected for surgical management.   Past Medical History:  Diagnosis Date   Allergic rhinitis, cause unspecified    Anemia, unspecified    Heart murmur    MVP   Hyperlipidemia    Hypothyroidism    Mastodynia    MVP (mitral valve prolapse)    Thyroid disease    hypothyroid   Toenail fungus 10/2022   Past Surgical History:  Procedure Laterality Date   ABDOMINAL HYSTERECTOMY  2004   MOUTH SURGERY     TMJ   Social History   Socioeconomic History   Marital status: Married    Spouse name: Not on file   Number of children: 2   Years of education: Not on file   Highest education level: Not on file  Occupational History   Occupation: Product manager: [employer]  Tobacco Use   Smoking status: Never   Smokeless tobacco: Never  Vaping Use   Vaping Use: Never used  Substance and Sexual Activity   Alcohol use: No    Alcohol/week: 0.0 standard drinks of alcohol   Drug use: No   Sexual activity: Not on file  Other Topics Concern   Not on file  Social History Narrative   Not on file   Social Determinants of Health   Financial Resource Strain: Not on file  Food Insecurity: Not on file  Transportation Needs: Not on file  Physical Activity: Not on file  Stress: Not on file  Social Connections: Not on file   Family History  Problem Relation Age of Onset   Colon cancer Neg Hx     Colon polyps Neg Hx    Esophageal cancer Neg Hx    Rectal cancer Neg Hx    Stomach cancer Neg Hx    Allergies  Allergen Reactions   Azithromycin     REACTION: joint swelling, stiffness   Mobic [Meloxicam] Hives    Looks like sun burn   Penicillins     REACTION: SOB, rash   Terbinafine And Related Diarrhea and Nausea Only   Prior to Admission medications   Medication Sig Start Date End Date Taking? Authorizing Provider  Biotin 5000 MCG CAPS Take 1 capsule by mouth daily.    [provider]  Cholecalciferol (VITAMIN D) 2000 UNITS tablet Take 4,000 Units by mouth daily.     [provider]  diclofenac (VOLTAREN) 75 MG EC tablet Take 1 tablet by mouth 2 (two) times daily. 07/18/20   [provider]  estradiol (ESTRACE) 1 MG tablet Take 1 tablet by mouth daily. 08/09/11   [provider]  fexofenadine (ALLEGRA) 180 MG tablet Take 180 mg by mouth every morning.    [provider]  fluconazole (DIFLUCAN) 150 MG tablet Take 1 tablet (150 mg total) by mouth once a week for 26 doses. 11/10/22 05/05/23  McDonald, Stephan Minister, DPM  fluticasone (FLONASE) 50 MCG/ACT nasal spray Place 2 sprays into both nostrils daily  as needed. Patient taking differently: Place 2 sprays into both nostrils daily. 12/03/21   Tower, Wynelle Fanny, MD  iron polysaccharides (NIFEREX) 150 MG capsule TAKE 1 CAPSULE (150 MG TOTAL) BY MOUTH EVERY OTHER DAY 09/24/22   Tower, Wynelle Fanny, MD  levothyroxine (SYNTHROID) 88 MCG tablet Take 1 tablet (88 mcg total) by mouth daily. Patient taking differently: Take 88 mcg by mouth daily before breakfast. 12/03/21   Tower, Wynelle Fanny, MD  meclizine (ANTIVERT) 12.5 MG tablet Take 1 tablet (12.5 mg total) by mouth 3 (three) times daily as needed for dizziness. 07/19/18   Lajean Saver, MD  Multiple Vitamins-Minerals (WOMENS MULTI GUMMIES) CHEW Chew 2 tablets by mouth daily.    [provider]  Omega-3 Fatty Acids (FISH OIL) 1200 MG CAPS Take 1 capsule by  mouth daily.    [provider]     Positive ROS: All other systems have been reviewed and were otherwise negative with the exception of those mentioned in the HPI and as above.  Physical Exam: General: Alert, no acute distress Cardiovascular: No pedal edema. Heart is regular and without murmur.  Respiratory: No cyanosis, no use of accessory musculature. Lungs are clear. GI: No organomegaly, abdomen is soft and non-tender Skin: No lesions in the area of chief complaint Neurologic: Sensation intact distally Psychiatric: Patient is competent for consent with normal mood and affect Lymphatic: No axillary or cervical lymphadenopathy  MUSCULOSKELETAL: Right thumb shows severe pain at [facility] joint.  There is subluxation.  Pinch is weak.  Positive grind test.  Neurovascular status good otherwise.  Median nerve compression is negative.  Assessment: Bilateral osteoarthritis of first carpometacarpal joint  Plan: Plan for Procedure(s): CARPOMETACARPAL (CMC) arthroplasty OF RIGHT THUMB  The risks benefits and alternatives were discussed with the patient including but not limited to the risks of nonoperative treatment, versus surgical intervention including infection, bleeding, nerve injury,  blood clots, cardiopulmonary complications, morbidity, mortality, among others, and they were willing to proceed.   Park Breed, MD [phone]   11/20/2022 12:13 PM

## 2022-11-25 MED ORDER — CEFAZOLIN SODIUM-DEXTROSE 2-4 GM/100ML-% IV SOLN
2.0000 g | INTRAVENOUS | Status: AC
  Administered 2022-11-26: 2 g via INTRAVENOUS

## 2022-11-25 MED ORDER — CHLORHEXIDINE GLUCONATE CLOTH 2 % EX PADS: 6.0000 | MEDICATED_PAD | Freq: Once | CUTANEOUS | Status: DC

## 2022-11-25 MED ORDER — ORAL CARE MOUTH RINSE: 15.0000 mL | Freq: Once | OROMUCOSAL | Status: AC

## 2022-11-25 MED ORDER — GABAPENTIN 300 MG PO CAPS: 300.0000 mg | ORAL_CAPSULE | ORAL | Status: AC

## 2022-11-25 MED ORDER — LACTATED RINGERS IV SOLN: INTRAVENOUS | Status: DC

## 2022-11-25 MED ORDER — FAMOTIDINE 20 MG PO TABS: 20.0000 mg | ORAL_TABLET | Freq: Once | ORAL | Status: AC

## 2022-11-25 MED ORDER — CHLORHEXIDINE GLUCONATE 0.12 % MT SOLN: 15.0000 mL | Freq: Once | OROMUCOSAL | Status: AC

## 2022-11-26 ENCOUNTER — Encounter: Payer: Self-pay | Admitting: Specialist

## 2022-11-26 ENCOUNTER — Ambulatory Visit: Payer: BC Managed Care – PPO | Admitting: Anesthesiology

## 2022-11-26 ENCOUNTER — Encounter
Admission: RE | Disposition: A | Discharge status: Home / Self Care | Payer: Self-pay | Source: Home / Self Care | Attending: Specialist

## 2022-11-26 ENCOUNTER — Other Ambulatory Visit: Payer: Self-pay

## 2022-11-26 ENCOUNTER — Ambulatory Visit
Admission: RE | Admit: 2022-11-26 | Discharge: 2022-11-26 | Disposition: A | Discharge status: Home / Self Care | Payer: BC Managed Care – PPO | Attending: Specialist | Admitting: Specialist

## 2022-11-26 ENCOUNTER — Ambulatory Visit: Payer: BC Managed Care – PPO

## 2022-11-26 ENCOUNTER — Ambulatory Visit: Payer: BC Managed Care – PPO | Admitting: Urgent Care

## 2022-11-26 DIAGNOSIS — E785 Hyperlipidemia, unspecified: Secondary | ICD-10-CM | POA: Insufficient documentation

## 2022-11-26 DIAGNOSIS — M180 Bilateral primary osteoarthritis of first carpometacarpal joints: Secondary | ICD-10-CM | POA: Diagnosis present

## 2022-11-26 DIAGNOSIS — D649 Anemia, unspecified: Secondary | ICD-10-CM | POA: Insufficient documentation

## 2022-11-26 DIAGNOSIS — E039 Hypothyroidism, unspecified: Secondary | ICD-10-CM | POA: Diagnosis not present

## 2022-11-26 DIAGNOSIS — R011 Cardiac murmur, unspecified: Secondary | ICD-10-CM | POA: Insufficient documentation

## 2022-11-26 DIAGNOSIS — I341 Nonrheumatic mitral (valve) prolapse: Secondary | ICD-10-CM | POA: Diagnosis not present

## 2022-11-26 DIAGNOSIS — I4510 Unspecified right bundle-branch block: Secondary | ICD-10-CM | POA: Diagnosis not present

## 2022-11-26 HISTORY — PX: CARPOMETACARPAL (CMC) FUSION OF THUMB: SHX6290

## 2022-11-26 SURGERY — CARPOMETACARPAL (CMC) FUSION OF THUMB
Anesthesia: General | Site: Thumb | Laterality: Right

## 2022-11-26 MED ORDER — FENTANYL CITRATE (PF) 100 MCG/2ML IJ SOLN
INTRAMUSCULAR | Status: DC | PRN
  Administered 2022-11-26: 25 ug via INTRAVENOUS
  Administered 2022-11-26: 50 ug via INTRAVENOUS
  Administered 2022-11-26: 25 ug via INTRAVENOUS

## 2022-11-26 MED ORDER — ACETAMINOPHEN 10 MG/ML IV SOLN: 1000.0000 mg | Freq: Once | INTRAVENOUS | Status: DC | PRN

## 2022-11-26 MED ORDER — FAMOTIDINE 20 MG PO TABS
ORAL_TABLET | ORAL | Status: AC
  Administered 2022-11-26: 20 mg via ORAL
  Filled 2022-11-26: qty 1

## 2022-11-26 MED ORDER — FENTANYL CITRATE (PF) 100 MCG/2ML IJ SOLN
INTRAMUSCULAR | Status: AC
  Filled 2022-11-26: qty 2

## 2022-11-26 MED ORDER — GABAPENTIN 400 MG PO CAPS: 400.0000 mg | ORAL_CAPSULE | Freq: Three times a day (TID) | ORAL | 3 refills | Status: DC

## 2022-11-26 MED ORDER — OXYCODONE HCL 5 MG/5ML PO SOLN: 5.0000 mg | Freq: Once | ORAL | Status: DC | PRN

## 2022-11-26 MED ORDER — NEOMYCIN-POLYMYXIN B GU 40-200000 IR SOLN
Status: AC
  Filled 2022-11-26: qty 20

## 2022-11-26 MED ORDER — ONDANSETRON HCL 4 MG/2ML IJ SOLN: 4.0000 mg | Freq: Once | INTRAMUSCULAR | Status: DC | PRN

## 2022-11-26 MED ORDER — CEFAZOLIN SODIUM-DEXTROSE 2-4 GM/100ML-% IV SOLN
INTRAVENOUS | Status: AC
  Filled 2022-11-26: qty 100

## 2022-11-26 MED ORDER — LACTATED RINGERS IV SOLN: INTRAVENOUS | Status: DC

## 2022-11-26 MED ORDER — HYDROCODONE-ACETAMINOPHEN 5-325 MG PO TABS: 1.0000 | ORAL_TABLET | Freq: Four times a day (QID) | ORAL | 0 refills | Status: DC | PRN

## 2022-11-26 MED ORDER — ACETAMINOPHEN 10 MG/ML IV SOLN
INTRAVENOUS | Status: DC | PRN
  Administered 2022-11-26: 1000 mg via INTRAVENOUS

## 2022-11-26 MED ORDER — MIDAZOLAM HCL 2 MG/2ML IJ SOLN
INTRAMUSCULAR | Status: DC | PRN
  Administered 2022-11-26: 2 mg via INTRAVENOUS

## 2022-11-26 MED ORDER — EPHEDRINE SULFATE (PRESSORS) 50 MG/ML IJ SOLN
INTRAMUSCULAR | Status: DC | PRN
  Administered 2022-11-26: 5 mg via INTRAVENOUS

## 2022-11-26 MED ORDER — FENTANYL CITRATE (PF) 100 MCG/2ML IJ SOLN: 25.0000 ug | INTRAMUSCULAR | Status: DC | PRN

## 2022-11-26 MED ORDER — PROPOFOL 10 MG/ML IV BOLUS
INTRAVENOUS | Status: AC
  Filled 2022-11-26: qty 40

## 2022-11-26 MED ORDER — LIDOCAINE HCL (CARDIAC) PF 100 MG/5ML IV SOSY
PREFILLED_SYRINGE | INTRAVENOUS | Status: DC | PRN
  Administered 2022-11-26: 60 mg via INTRAVENOUS

## 2022-11-26 MED ORDER — ONDANSETRON HCL 4 MG/2ML IJ SOLN
INTRAMUSCULAR | Status: DC | PRN
  Administered 2022-11-26: 4 mg via INTRAVENOUS

## 2022-11-26 MED ORDER — GLYCOPYRROLATE 0.2 MG/ML IJ SOLN
INTRAMUSCULAR | Status: DC | PRN
  Administered 2022-11-26: .2 mg via INTRAVENOUS

## 2022-11-26 MED ORDER — MIDAZOLAM HCL 2 MG/2ML IJ SOLN
INTRAMUSCULAR | Status: AC
  Filled 2022-11-26: qty 2

## 2022-11-26 MED ORDER — CHLORHEXIDINE GLUCONATE 0.12 % MT SOLN
OROMUCOSAL | Status: AC
  Administered 2022-11-26: 15 mL via OROMUCOSAL
  Filled 2022-11-26: qty 15

## 2022-11-26 MED ORDER — PROPOFOL 10 MG/ML IV BOLUS
INTRAVENOUS | Status: DC | PRN
  Administered 2022-11-26: 120 mg via INTRAVENOUS

## 2022-11-26 MED ORDER — DEXAMETHASONE SODIUM PHOSPHATE 10 MG/ML IJ SOLN
INTRAMUSCULAR | Status: DC | PRN
  Administered 2022-11-26: 8 mg via INTRAVENOUS

## 2022-11-26 MED ORDER — SODIUM CHLORIDE 0.9 % IV SOLN
Status: DC | PRN
  Administered 2022-11-26: 1004 mL

## 2022-11-26 MED ORDER — ACETAMINOPHEN 10 MG/ML IV SOLN
INTRAVENOUS | Status: AC
  Filled 2022-11-26: qty 100

## 2022-11-26 MED ORDER — OXYCODONE HCL 5 MG PO TABS: 5.0000 mg | ORAL_TABLET | Freq: Once | ORAL | Status: DC | PRN

## 2022-11-26 MED ORDER — GABAPENTIN 300 MG PO CAPS
ORAL_CAPSULE | ORAL | Status: AC
  Administered 2022-11-26: 300 mg via ORAL
  Filled 2022-11-26: qty 1

## 2022-11-26 MED ORDER — BUPIVACAINE HCL (PF) 0.5 % IJ SOLN
INTRAMUSCULAR | Status: DC | PRN
  Administered 2022-11-26: 25 mL

## 2022-11-26 MED ORDER — BUPIVACAINE HCL (PF) 0.5 % IJ SOLN
INTRAMUSCULAR | Status: AC
  Filled 2022-11-26: qty 30

## 2022-11-26 SURGICAL SUPPLY — 37 items
APL PRP STRL LF DISP 70% ISPRP (MISCELLANEOUS) ×1
BLADE OSC/SAGITTAL MD 5.5X18 (BLADE) ×1 IMPLANT
BLADE SURG MINI STRL (BLADE) ×1 IMPLANT
BNDG ESMARCH 4 X 12 STRL LF (GAUZE/BANDAGES/DRESSINGS) ×1
BNDG ESMARCH 4X12 STRL LF (GAUZE/BANDAGES/DRESSINGS) ×1 IMPLANT
CHLORAPREP W/TINT 26 (MISCELLANEOUS) ×1 IMPLANT
CUFF TOURN SGL QUICK 18X4 (TOURNIQUET CUFF) ×1 IMPLANT
DRAPE FLUOR MINI C-ARM 54X84 (DRAPES) ×1 IMPLANT
DRSG GAUZE FLUFF 36X18 (GAUZE/BANDAGES/DRESSINGS) ×3 IMPLANT
ELECT REM PT RETURN 9FT ADLT (ELECTROSURGICAL) ×1
ELECTRODE REM PT RTRN 9FT ADLT (ELECTROSURGICAL) ×1 IMPLANT
GAUZE XEROFORM 1X8 LF (GAUZE/BANDAGES/DRESSINGS) ×1 IMPLANT
GLOVE BIO SURGEON STRL SZ7.5 (GLOVE) ×1 IMPLANT
GLOVE BIO SURGEON STRL SZ8 (GLOVE) IMPLANT
GOWN STRL REUS W/ TWL LRG LVL3 (GOWN DISPOSABLE) ×1 IMPLANT
GOWN STRL REUS W/TWL LRG LVL3 (GOWN DISPOSABLE) ×2
GOWN STRL REUS W/TWL LRG LVL4 (GOWN DISPOSABLE) ×1 IMPLANT
KIT TURNOVER KIT A (KITS) ×1 IMPLANT
LOOP VESSEL MINI 0.8X406 BLUE (MISCELLANEOUS) ×1 IMPLANT
MANIFOLD NEPTUNE II (INSTRUMENTS) ×1 IMPLANT
PACK EXTREMITY ARMC (MISCELLANEOUS) ×1 IMPLANT
PAD CAST 4YDX4 CTTN HI CHSV (CAST SUPPLIES) ×1 IMPLANT
PADDING CAST COTTON 4X4 STRL (CAST SUPPLIES) ×1
PASSER SUT SWANSON 36MM LOOP (INSTRUMENTS) ×1 IMPLANT
SPLINT CAST 1 STEP 3X12 (MISCELLANEOUS) ×1 IMPLANT
SPONGE T-LAP 18X18 ~~LOC~~+RFID (SPONGE) ×1 IMPLANT
STOCKINETTE 48X4 2 PLY STRL (GAUZE/BANDAGES/DRESSINGS) ×1 IMPLANT
STOCKINETTE BIAS CUT 4 980044 (GAUZE/BANDAGES/DRESSINGS) ×1 IMPLANT
STOCKINETTE STRL 4IN 9604848 (GAUZE/BANDAGES/DRESSINGS) ×1 IMPLANT
SUT ETHILON 5-0 FS-2 18 BLK (SUTURE) ×1 IMPLANT
SUT VIC AB 3-0 SH 27 (SUTURE) ×1
SUT VIC AB 3-0 SH 27X BRD (SUTURE) IMPLANT
SUT VIC AB 4-0 SH 27 (SUTURE) ×1
SUT VIC AB 4-0 SH 27XANBCTRL (SUTURE) ×1 IMPLANT
SYR 30ML LL (SYRINGE) ×1 IMPLANT
TRAP FLUID SMOKE EVACUATOR (MISCELLANEOUS) ×1 IMPLANT
WIRE Z .062 C-WIRE SPADE TIP (WIRE) ×1 IMPLANT

## 2022-11-26 NOTE — Anesthesia Preprocedure Evaluation (Addendum)
Anesthesia Evaluation  Patient identified by MRN, date of birth, ID band Patient awake    Reviewed: Allergy & Precautions, NPO status , Patient's Chart, lab work & pertinent test results  History of Anesthesia Complications Negative for: history of anesthetic complications  Airway Mallampati: I   Neck ROM: Full    Dental  (+) Missing   Pulmonary neg pulmonary ROS   Pulmonary exam normal breath sounds clear to auscultation       Cardiovascular Exercise Tolerance: Good + Valvular Problems/Murmurs MVP  Rhythm:Regular Rate:Normal + Systolic murmurs ECG [id number]:  Normal sinus rhythm Possible Left atrial enlargement Left axis deviation Incomplete right bundle branch block   Neuro/Psych negative neurological ROS     GI/Hepatic negative GI ROS,,,  Endo/Other  Hypothyroidism    Renal/GU negative Renal ROS     Musculoskeletal  (+) Arthritis ,    Abdominal   Peds  Hematology  (+) Blood dyscrasia, anemia   Anesthesia Other Findings   Reproductive/Obstetrics                             Anesthesia Physical Anesthesia Plan  ASA: 2  Anesthesia Plan: General   Post-op Pain Management:    Induction: Intravenous  PONV Risk Score and Plan: 3 and Ondansetron, Dexamethasone and Treatment may vary due to age or medical condition  Airway Management Planned: LMA  Additional Equipment:   Intra-op Plan:   Post-operative Plan: Extubation in OR  Informed Consent: I have reviewed the patients History and Physical, chart, labs and discussed the procedure including the risks, benefits and alternatives for the proposed anesthesia with the patient or authorized representative who has indicated his/her understanding and acceptance.     Dental advisory given  Plan Discussed with: CRNA  Anesthesia Plan Comments: (Patient consented for risks of anesthesia including but not limited to:  - adverse  reactions to medications - damage to eyes, teeth, lips or other oral mucosa - nerve damage due to positioning  - sore throat or hoarseness - damage to heart, brain, nerves, lungs, other parts of body or loss of life  Informed patient about role of CRNA in peri- and intra-operative care.  Patient voiced understanding.)        Anesthesia Quick Evaluation

## 2022-11-26 NOTE — H&P (Signed)
THE PATIENT WAS SEEN PRIOR TO SURGERY TODAY.  HISTORY, ALLERGIES, HOME MEDICATIONS AND OPERATIVE PROCEDURE WERE REVIEWED. RISKS AND BENEFITS OF SURGERY DISCUSSED WITH PATIENT AGAIN.  NO CHANGES FROM INITIAL HISTORY AND PHYSICAL NOTED.    

## 2022-11-26 NOTE — Op Note (Signed)
11/26/2022  9:42 AM  PATIENT:  Ruth Gill    PRE-OPERATIVE DIAGNOSIS:  Bilateral osteoarthritis of first carpometacarpal joint  POST-OPERATIVE DIAGNOSIS:  Same  PROCEDURE:  CARPOMETACARPAL (CMC) arthroplasty right thumb using palmaris longus tendon  SURGEON:  Park Breed, MD  ANESTHESIA:   General  PREOPERATIVE INDICATIONS:  Ruth Gill is a  62 y.o. female with a diagnosis of Bilateral osteoarthritis of first carpometacarpal joint who failed conservative measures and elected for surgical management.    The risks benefits and alternatives were discussed with the patient preoperatively including but not limited to the risks of infection, bleeding, nerve injury, cardiopulmonary complications, the need for revision surgery, among others, and the patient was willing to proceed.  EBL: None  TOURNIQUET TIME: 71 MIN  OPERATIVE IMPLANTS: None  OPERATIVE FINDINGS: Advanced arthritis right thumb cmc joint  OPERATIVE PROCEDURE:  The patient was brought to the operating room and underwent satisfactory general anesthesia in the supine position.  The operative arm was prepped and draped in a sterile fashion.  The patient was noted to have an excellent palmaris longus tendon.  3 short transverse incisions were made over the course of the tendon and it was harvested under direct vision.  It was then placed in a moist sponge on the back table.  An S-shaped incision was then made over the base of the thumb metacarpal dorsally.  Dissection was carried out carefully under loupe magnification, carefully sparing the sensory nerves.  The tendon sheath around the abductor pollicis longus and extensor pollicis brevis was released under direct vision, including over the radial styloid.  Nerves were carefully spared.  Retractors were inserted and the capsule of the trapezium and metacarpal were opened longitudinally.  The radial artery and veins were carefully dissected free and retracted with a  vessel loop drain.  The capsule was dissected off the trapezium and the trapezium was then morselized with the oscillating saw and rongeur.  The flexor carpi radialis tendon was freed up in the base of the wound.  The palmaris longus tendon was passed beneath this.  A 3.5 mm drill was used to create a tunnel through the base of the metacarpal.  The tendon was then passed up through the metacarpal using a tendon passer.  The thumb had been placed in traction which was then released.  The tendon was tied upon itself and the knot was sutured to itself prevent slippage.  It was then tied in multiple knots and sutured into a ball.  This was then rotated into the space between the metacarpal and the scaphoid.  The capsule was then carefully and thoroughly, closed with 2-0 Vicryl suture.  After irrigation, the skin was closed with 5-0 nylon.  The forearm wounds had been closed with a similar suture.  A well-padded thumb spica splint was applied.  Tourniquet was deflated with good return of blood flow to the hand.  Sponge and needle counts were correct.  Patient was taken to recovery in good condition.  Park Breed, MD

## 2022-11-26 NOTE — Discharge Instructions (Signed)

## 2022-11-26 NOTE — Anesthesia Postprocedure Evaluation (Signed)
Anesthesia Post Note  Patient: Ruth Gill  Procedure(s) Performed: CARPOMETACARPAL ([place]) FUSION OF THUMB (Right: Thumb)  Patient location during evaluation: PACU Anesthesia Type: General Level of consciousness: awake and alert, oriented and patient cooperative Pain management: pain level controlled Vital Signs Assessment: post-procedure vital signs reviewed and stable Respiratory status: spontaneous breathing, nonlabored ventilation and respiratory function stable Cardiovascular status: blood pressure returned to baseline and stable Postop Assessment: adequate PO intake Anesthetic complications: no   No notable events documented.   Last Vitals:  Vitals:   11/26/22 1041 11/26/22 1052  BP: (!) 155/81 (!) 160/88  Pulse: 61 (!) 58  Resp: 15 15  Temp: 37.1 C 36.5 C  SpO2: 98% 99%    Last Pain:  Vitals:   11/26/22 1052  TempSrc: Temporal  PainSc: 0-No pain                 Darrin Nipper

## 2022-11-26 NOTE — Anesthesia Procedure Notes (Signed)
Procedure Name: LMA Insertion Date/Time: 11/26/2022 7:55 AM  Performed by: Lerry Liner, CRNAPre-anesthesia Checklist: Patient identified, Emergency Drugs available, Suction available and Patient being monitored Patient Re-evaluated:Patient Re-evaluated prior to induction Oxygen Delivery Method: Circle system utilized Preoxygenation: Pre-oxygenation with 100% oxygen Induction Type: IV induction Ventilation: Mask ventilation without difficulty LMA: LMA inserted LMA Size: 3.0 Number of attempts: 1 Placement Confirmation: positive ETCO2 and breath sounds checked- equal and bilateral Tube secured with: Tape Dental Injury: Teeth and Oropharynx as per pre-operative assessment

## 2022-11-26 NOTE — Transfer of Care (Signed)
Immediate Anesthesia Transfer of Care Note  Patient: Ruth Gill  Procedure(s) Performed: CARPOMETACARPAL ([place]) FUSION OF THUMB (Right: Thumb)  Patient Location: PACU  Anesthesia Type:General  Level of Consciousness: drowsy  Airway & Oxygen Therapy: Patient Spontanous Breathing and Patient connected to nasal cannula oxygen  Post-op Assessment: Report given to RN  Post vital signs: stable  Last Vitals:  Vitals Value Taken Time  BP 152/83 11/26/22 0947  Temp    Pulse 68 11/26/22 0948  Resp 18 11/26/22 0949  SpO2 100 % 11/26/22 0948  Vitals shown include unvalidated device data.  Last Pain:  Vitals:   11/26/22 0618  TempSrc: Oral  PainSc: 5          Complications: No notable events documented.

## 2022-11-29 ENCOUNTER — Telehealth: Payer: Self-pay | Admitting: Family Medicine

## 2022-11-29 DIAGNOSIS — E611 Iron deficiency: Secondary | ICD-10-CM

## 2022-11-29 DIAGNOSIS — Z0000 Encounter for general adult medical examination without abnormal findings: Secondary | ICD-10-CM

## 2022-11-29 DIAGNOSIS — E039 Hypothyroidism, unspecified: Secondary | ICD-10-CM

## 2022-11-29 DIAGNOSIS — E7800 Pure hypercholesterolemia, unspecified: Secondary | ICD-10-CM

## 2022-11-29 NOTE — Telephone Encounter (Signed)
-----   Message from Velna Hatchet, RT sent at 11/17/2022  8:34 AM EST ----- Regarding: Mon 3/4 lab Patient is scheduled for cpx, please order future labs.  Thanks, Anda Kraft

## 2022-11-30 ENCOUNTER — Other Ambulatory Visit (INDEPENDENT_AMBULATORY_CARE_PROVIDER_SITE_OTHER): Payer: BC Managed Care – PPO

## 2022-11-30 DIAGNOSIS — E7800 Pure hypercholesterolemia, unspecified: Secondary | ICD-10-CM | POA: Diagnosis not present

## 2022-11-30 DIAGNOSIS — E039 Hypothyroidism, unspecified: Secondary | ICD-10-CM

## 2022-11-30 DIAGNOSIS — E611 Iron deficiency: Secondary | ICD-10-CM

## 2022-11-30 DIAGNOSIS — Z0000 Encounter for general adult medical examination without abnormal findings: Secondary | ICD-10-CM

## 2022-11-30 LAB — CBC WITH DIFFERENTIAL/PLATELET
Basophils Absolute: 0.1 10*3/uL (ref 0.0–0.1)
Basophils Relative: 0.9 % (ref 0.0–3.0)
Eosinophils Absolute: 0.2 10*3/uL (ref 0.0–0.7)
Eosinophils Relative: 2.7 % (ref 0.0–5.0)
HCT: 36.7 % (ref 36.0–46.0)
Hemoglobin: 12.5 g/dL (ref 12.0–15.0)
Lymphocytes Relative: 32 % (ref 12.0–46.0)
Lymphs Abs: 2 10*3/uL (ref 0.7–4.0)
MCHC: 33.9 g/dL (ref 30.0–36.0)
MCV: 96.2 fl (ref 78.0–100.0)
Monocytes Absolute: 0.5 10*3/uL (ref 0.1–1.0)
Monocytes Relative: 7.8 % (ref 3.0–12.0)
Neutro Abs: 3.5 10*3/uL (ref 1.4–7.7)
Neutrophils Relative %: 56.6 % (ref 43.0–77.0)
Platelets: 212 10*3/uL (ref 150.0–400.0)
RBC: 3.82 Mil/uL — ABNORMAL LOW (ref 3.87–5.11)
RDW: 12.2 % (ref 11.5–15.5)
WBC: 6.2 10*3/uL (ref 4.0–10.5)

## 2022-11-30 LAB — COMPREHENSIVE METABOLIC PANEL
ALT: 14 U/L (ref 0–35)
AST: 15 U/L (ref 0–37)
Albumin: 3.6 g/dL (ref 3.5–5.2)
Alkaline Phosphatase: 49 U/L (ref 39–117)
BUN: 18 mg/dL (ref 6–23)
CO2: 32 mEq/L (ref 19–32)
Calcium: 9.3 mg/dL (ref 8.4–10.5)
Chloride: 101 mEq/L (ref 96–112)
Creatinine, Ser: 0.77 mg/dL (ref 0.40–1.20)
GFR: 82.18 mL/min (ref >60.00)
Glucose, Bld: 82 mg/dL (ref 70–99)
Potassium: 4.1 mEq/L (ref 3.5–5.1)
Sodium: 140 mEq/L (ref 135–145)
Total Bilirubin: 0.3 mg/dL (ref 0.2–1.2)
Total Protein: 6.2 g/dL (ref 6.0–8.3)

## 2022-11-30 LAB — FERRITIN: Ferritin: 53.7 ng/mL (ref 10.0–291.0)

## 2022-11-30 LAB — IRON: Iron: 69 ug/dL (ref 42–145)

## 2022-11-30 LAB — TSH: TSH: 0.37 u[IU]/mL (ref 0.35–5.50)

## 2022-11-30 LAB — LIPID PANEL
Cholesterol: 162 mg/dL (ref 0–200)
HDL: 48.6 mg/dL (ref >39.00)
LDL Cholesterol: 76 mg/dL (ref 0–99)
NonHDL: 113.88
Total CHOL/HDL Ratio: 3
Triglycerides: 189 mg/dL — ABNORMAL HIGH (ref 0.0–149.0)
VLDL: 37.8 mg/dL (ref 0.0–40.0)

## 2022-12-05 ENCOUNTER — Other Ambulatory Visit: Payer: Self-pay | Admitting: Family Medicine

## 2022-12-07 ENCOUNTER — Encounter: Payer: Self-pay | Admitting: Family Medicine

## 2022-12-07 ENCOUNTER — Ambulatory Visit (INDEPENDENT_AMBULATORY_CARE_PROVIDER_SITE_OTHER): Payer: BC Managed Care – PPO | Admitting: Family Medicine

## 2022-12-07 VITALS — BP 125/70 | HR 62 | Temp 97.2°F | Ht 62.5 in | Wt 131.4 lb

## 2022-12-07 DIAGNOSIS — Z1211 Encounter for screening for malignant neoplasm of colon: Secondary | ICD-10-CM | POA: Diagnosis not present

## 2022-12-07 DIAGNOSIS — E7800 Pure hypercholesterolemia, unspecified: Secondary | ICD-10-CM

## 2022-12-07 DIAGNOSIS — E039 Hypothyroidism, unspecified: Secondary | ICD-10-CM

## 2022-12-07 DIAGNOSIS — Z0000 Encounter for general adult medical examination without abnormal findings: Secondary | ICD-10-CM

## 2022-12-07 DIAGNOSIS — E611 Iron deficiency: Secondary | ICD-10-CM

## 2022-12-07 MED ORDER — POLYSACCHARIDE IRON COMPLEX 150 MG PO CAPS: 150.0000 mg | ORAL_CAPSULE | ORAL | 0 refills | Status: DC

## 2022-12-07 MED ORDER — LEVOTHYROXINE SODIUM 88 MCG PO TABS: 88.0000 ug | ORAL_TABLET | Freq: Every day | ORAL | 3 refills | Status: DC

## 2022-12-07 NOTE — Assessment & Plan Note (Signed)
Colonoscopy utd from 2020  

## 2022-12-07 NOTE — Progress Notes (Signed)
Subjective:    Patient ID: Ruth Gill, female    DOB: 1960-11-14, 62 y.o.   MRN: [id number]  HPI Here for health maintenance exam and to review chronic medical problems    Wt Readings from Last 3 Encounters:  12/07/22 131 lb 6 oz (59.6 kg)  11/26/22 132 lb 7.9 oz (60.1 kg)  11/19/22 132 lb 7.9 oz (60.1 kg)   23.65 kg/m  Had surgery on her hand /thumb R hand-dominant hand Will change to brace soon     Immunization History  Administered Date(s) Administered   Influenza,inj,Quad PF,6+ Mos 08/04/2016, 09/01/2017, 09/07/2018, 09/11/2019, 09/11/2020, 09/24/2021, 09/07/2022   PFIZER(Purple Top)SARS-COV-2 Vaccination 12/22/2019, 01/12/2020   Td 01/27/1999   Tdap 01/12/2014   Zoster Recombinat (Shingrix) 07/26/2020   Health Maintenance Due  Topic Date Due   HIV Screening  Never done   Hepatitis C Screening  Never done   Zoster Vaccines- Shingrix (2 of 2) 09/20/2020   COVID-19 Vaccine (3 - 2023-24 season) 05/29/2022    Mammogram 11/2021 at gyn Has gyn appt in April - sees Vanessa [facility]  Has had a hysterectomy   Is on HRT   Colonoscopy 09/2018   BP Readings from Last 3 Encounters:  12/07/22 125/70  11/26/22 (!) 160/88  10/19/22 (!) 146/92    Has been watching bp Has some white coat   Not exercising until she gets cast off Has a 5 K coming up in may   Is on diclofenac right now for her hand   Pulse Readings from Last 3 Encounters:  12/07/22 62  11/26/22 (!) 58  10/19/22 80   Busy Cares for mother in her 35s    Hypothyroidism  Pt has no clinical changes No change in energy level/ hair or skin/ edema and no tremor Lab Results  Component Value Date   TSH 0.37 11/30/2022    Levothyroxine 88 mcg daily  Hyperlipidemia Lab Results  Component Value Date   CHOL 162 11/30/2022   CHOL 224 (H) 11/26/2021   CHOL 186 09/04/2020   Lab Results  Component Value Date   HDL 48.60 11/30/2022   HDL 67.50 11/26/2021   HDL 62.70 09/04/2020    Lab Results  Component Value Date   LDLCALC 76 11/30/2022   LDLCALC 136 (H) 11/26/2021   LDLCALC 105 (H) 09/04/2020   Lab Results  Component Value Date   TRIG 189.0 (H) 11/30/2022   TRIG 107.0 11/26/2021   TRIG 91.0 09/04/2020   Lab Results  Component Value Date   CHOLHDL 3 11/30/2022   CHOLHDL 3 11/26/2021   CHOLHDL 3 09/04/2020   No results found for: "LDLDIRECT"  LDL is down to 76  Making better food choices  Eats more fruit     H/o iron def Lab Results  Component Value Date   IRON 69 11/30/2022   FERRITIN 53.7 11/30/2022   Niferex every other day   Patient Active Problem List   Diagnosis Date Noted   Onychomycosis 09/07/2022   Hyperlipidemia 12/03/2021   Iron deficiency 05/26/2021   Fatigue 04/15/2021   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 01/22/2020   Bilateral hand pain 01/12/2020   Elevated blood pressure reading 08/04/2016   Hypothyroid 04/13/2014   Colon cancer screening 01/12/2014   Routine general medical examination at a health care facility 01/04/2014   Allergic rhinitis 12/22/2007   Past Medical History:  Diagnosis Date   Allergic rhinitis, cause unspecified    Anemia, unspecified  Heart murmur    MVP   Hyperlipidemia    Hypothyroidism    Mastodynia    MVP (mitral valve prolapse)    Thyroid disease    hypothyroid   Toenail fungus 10/2022   Past Surgical History:  Procedure Laterality Date   ABDOMINAL HYSTERECTOMY  2004   CARPOMETACARPAL [facility]) FUSION OF THUMB Right 11/26/2022   Procedure: CARPOMETACARPAL [facility]) FUSION OF THUMB;  Surgeon: Earnestine Leys, MD;  Location: [facility] ORS;  Service: Orthopedics;  Laterality: Right;  arthroplasty   MOUTH SURGERY     TMJ   Social History   Tobacco Use   Smoking status: Never   Smokeless tobacco: Never  Vaping Use   Vaping Use: Never used  Substance Use Topics   Alcohol use: No    Alcohol/week: 0.0 standard drinks of alcohol   Drug use: No   Family  History  Problem Relation Age of Onset   Colon cancer Neg Hx    Colon polyps Neg Hx    Esophageal cancer Neg Hx    Rectal cancer Neg Hx    Stomach cancer Neg Hx    Allergies  Allergen Reactions   Azithromycin     REACTION: joint swelling, stiffness   Mobic [Meloxicam] Hives    Looks like sun burn   Penicillins     REACTION: SOB, rash   Terbinafine And Related Diarrhea and Nausea Only   Current Outpatient Medications on File Prior to Visit  Medication Sig Dispense Refill   Biotin 5000 MCG CAPS Take 1 capsule by mouth daily.     Cholecalciferol (VITAMIN D) 2000 UNITS tablet Take 4,000 Units by mouth daily.      diclofenac (VOLTAREN) 75 MG EC tablet Take 1 tablet by mouth 2 (two) times daily.     estradiol (ESTRACE) 1 MG tablet Take 1 tablet by mouth daily.     fexofenadine (ALLEGRA) 180 MG tablet Take 180 mg by mouth every morning.     fluconazole (DIFLUCAN) 150 MG tablet Take 1 tablet (150 mg total) by mouth once a week for 26 doses. 26 tablet 0   fluticasone (FLONASE) 50 MCG/ACT nasal spray Place 2 sprays into both nostrils daily as needed. (Patient taking differently: Place 2 sprays into both nostrils daily.) 16 g 11   gabapentin (NEURONTIN) 400 MG capsule Take 1 capsule (400 mg total) by mouth 3 (three) times daily. 60 capsule 3   meclizine (ANTIVERT) 12.5 MG tablet Take 1 tablet (12.5 mg total) by mouth 3 (three) times daily as needed for dizziness. 15 tablet 0   Multiple Vitamins-Minerals (WOMENS MULTI GUMMIES) CHEW Chew 2 tablets by mouth daily.     Omega-3 Fatty Acids (FISH OIL) 1200 MG CAPS Take 1 capsule by mouth daily.     No current facility-administered medications on file prior to visit.    Review of Systems  Constitutional:  Negative for activity change, appetite change, fatigue, fever and unexpected weight change.  HENT:  Negative for congestion, ear pain, rhinorrhea, sinus pressure and sore throat.   Eyes:  Negative for pain, redness and visual disturbance.   Respiratory:  Negative for cough, shortness of breath and wheezing.   Cardiovascular:  Negative for chest pain and palpitations.  Gastrointestinal:  Negative for abdominal pain, blood in stool, constipation and diarrhea.  Endocrine: Negative for polydipsia and polyuria.  Genitourinary:  Negative for dysuria, frequency and urgency.  Musculoskeletal:  Negative for arthralgias, back pain and myalgias.       R hand in cast  from recent surgery  Skin:  Negative for pallor and rash.  Allergic/Immunologic: Negative for environmental allergies.  Neurological:  Negative for dizziness, syncope and headaches.  Hematological:  Negative for adenopathy. Does not bruise/bleed easily.  Psychiatric/Behavioral:  Negative for decreased concentration and dysphoric mood. The patient is not nervous/anxious.         Objective:   Physical Exam Constitutional:      General: She is not in acute distress.    Appearance: Normal appearance. She is well-developed. She is not ill-appearing or diaphoretic.  HENT:     Head: Normocephalic and atraumatic.     Right Ear: Tympanic membrane, ear canal and external ear normal.     Left Ear: Tympanic membrane, ear canal and external ear normal.     Nose: Nose normal. No congestion.     Mouth/Throat:     Mouth: Mucous membranes are moist.     Pharynx: Oropharynx is clear. No posterior oropharyngeal erythema.  Eyes:     General: No scleral icterus.    Extraocular Movements: Extraocular movements intact.     Conjunctiva/sclera: Conjunctivae normal.     Pupils: Pupils are equal, round, and reactive to light.  Neck:     Thyroid: No thyromegaly.     Vascular: No carotid bruit or JVD.  Cardiovascular:     Rate and Rhythm: Normal rate and regular rhythm.     Pulses: Normal pulses.     Heart sounds: Normal heart sounds.     No gallop.  Pulmonary:     Effort: Pulmonary effort is normal. No respiratory distress.     Breath sounds: Normal breath sounds. No wheezing.      Comments: Good air exch Chest:     Chest wall: No tenderness.  Abdominal:     General: Bowel sounds are normal. There is no distension or abdominal bruit.     Palpations: Abdomen is soft. There is no mass.     Tenderness: There is no abdominal tenderness.     Hernia: No hernia is present.  Genitourinary:    Comments: Breast and pelvic exams are done by gyn provider  Musculoskeletal:        General: No tenderness. Normal range of motion.     Cervical back: Normal range of motion and neck supple. No rigidity. No muscular tenderness.     Right lower leg: No edema.     Left lower leg: No edema.     Comments: No kyphosis   R hand in a cast  Lymphadenopathy:     Cervical: No cervical adenopathy.  Skin:    General: Skin is warm and dry.     Coloration: Skin is not pale.     Findings: No erythema or rash.     Comments: Solar lentigines diffusely   Neurological:     Mental Status: She is alert. Mental status is at baseline.     Cranial Nerves: No cranial nerve deficit.     Motor: No abnormal muscle tone.     Coordination: Coordination normal.     Gait: Gait normal.     Deep Tendon Reflexes: Reflexes are normal and symmetric. Reflexes normal.  Psychiatric:        Mood and Affect: Mood normal.        Cognition and Memory: Cognition and memory normal.           Assessment & Plan:   Problem List Items Addressed This Visit       Endocrine  Hypothyroid    Hypothyroidism  Pt has no clinical changes No change in energy level/ hair or skin/ edema and no tremor Lab Results  Component Value Date   TSH 0.37 11/30/2022    Continues levothyroxine 88 mcg daily      Relevant Medications   levothyroxine (SYNTHROID) 88 MCG tablet     Other   Colon cancer screening    Colonoscopy utd from 2020      Hyperlipidemia    Disc goals for lipids and reasons to control them Rev last labs with pt Rev low sat fat diet in detail Improved with diet  LDl down to 76 Commended         Iron deficiency    Iron and ferritin are in nl range with QOD ferritin       Routine general medical examination at a health care facility - Primary    Reviewed health habits including diet and exercise and skin cancer prevention Reviewed appropriate screening tests for age  Also reviewed health mt list, fam hx and immunization status , as well as social and family history   See HPI Labs reviewed  Mammogram and gyn visit are planned next mo  Colonoscopu utd 09/2018 Bp better on 2nd check  Enc ca and D and exercise for bone health

## 2022-12-07 NOTE — Assessment & Plan Note (Signed)
Hypothyroidism  Pt has no clinical changes No change in energy level/ hair or skin/ edema and no tremor Lab Results  Component Value Date   TSH 0.37 11/30/2022    Continues levothyroxine 88 mcg daily

## 2022-12-07 NOTE — Assessment & Plan Note (Signed)
Disc goals for lipids and reasons to control them Rev last labs with pt Rev low sat fat diet in detail Improved with diet  LDl down to 76 Commended

## 2022-12-07 NOTE — Patient Instructions (Signed)
Get back to exercise when you can Think about strength training   Take care of yourself Keep eating healthy  Avoid red meat/ fried foods/ egg yolks/ fatty breakfast meats/ butter, cheese and high fat dairy/ and shellfish    No medicine changes Follow up with your gyn

## 2022-12-07 NOTE — Assessment & Plan Note (Signed)
Iron and ferritin are in nl range with QOD ferritin

## 2022-12-07 NOTE — Assessment & Plan Note (Signed)
Reviewed health habits including diet and exercise and skin cancer prevention Reviewed appropriate screening tests for age  Also reviewed health mt list, fam hx and immunization status , as well as social and family history   See HPI Labs reviewed  Mammogram and gyn visit are planned next mo  Colonoscopu utd 09/2018 Bp better on 2nd check  Enc ca and D and exercise for bone health

## 2022-12-19 ENCOUNTER — Other Ambulatory Visit: Payer: Self-pay | Admitting: Family Medicine

## 2023-01-05 LAB — HM MAMMOGRAPHY

## 2023-01-11 ENCOUNTER — Encounter: Payer: Self-pay | Admitting: Podiatry

## 2023-01-11 ENCOUNTER — Ambulatory Visit (INDEPENDENT_AMBULATORY_CARE_PROVIDER_SITE_OTHER): Payer: BC Managed Care – PPO | Admitting: Podiatry

## 2023-01-11 VITALS — BP 130/85 | HR 63

## 2023-01-11 DIAGNOSIS — B351 Tinea unguium: Secondary | ICD-10-CM

## 2023-01-11 MED ORDER — EFINACONAZOLE 10 % EX SOLN: 1.0000 [drp] | Freq: Every day | CUTANEOUS | 11 refills | Status: AC

## 2023-01-11 NOTE — Progress Notes (Signed)
  Subjective:  Patient ID: Ruth Gill, female    DOB: [birth year],  MRN: [id number]  Chief Complaint  Patient presents with   Nail Problem    "I can't tell a difference.  The Terbinafine was causing nausea so I stopped taking it and he put me on Fluoconazole or something like that."    62 y.o. female presents with the above complaint. History confirmed with patient.   Objective:  Physical Exam: warm, good capillary refill, no trophic changes or ulcerative lesions, normal DP and PT pulses, normal sensory exam, and left hallux onychomycosis, not much change in size visit.     Assessment:   1. Onychomycosis      Plan:  Patient was evaluated and treated and all questions answered.  Continue fluconazole weekly.  She has had 2 months of this so far.  Will need the full 60-month course to see results.  I recommended addition of topical Jublia.  Rx sent to specialty pharmacy.  I will see her back in 4 months for follow-up.  We discussed if there is no change from her initial pictures examined would recommend temporary nail avulsion.  Return in about 4 months (around 05/13/2023) for follow up after nail fungus treatment.

## 2023-03-07 ENCOUNTER — Other Ambulatory Visit: Payer: Self-pay | Admitting: Family Medicine

## 2023-05-12 ENCOUNTER — Ambulatory Visit: Payer: BC Managed Care – PPO | Admitting: Podiatry

## 2023-05-19 ENCOUNTER — Encounter: Payer: Self-pay | Admitting: Podiatry

## 2023-05-19 ENCOUNTER — Ambulatory Visit: Payer: BC Managed Care – PPO | Admitting: Podiatry

## 2023-05-19 DIAGNOSIS — B351 Tinea unguium: Secondary | ICD-10-CM | POA: Diagnosis not present

## 2023-05-19 NOTE — Progress Notes (Signed)
  Subjective:  Patient ID: Ruth Gill, female    DOB: [birth year],  MRN: [id number]  Chief Complaint  Patient presents with   Nail Problem    "It's doing so much better.  I'm very pleased.  It's growing and the color is changing.  I only have one pill left of the oral medication that he gave me."    62 y.o. female presents with the above complaint. History confirmed with patient.   Objective:  Physical Exam: warm, good capillary refill, no trophic changes or ulcerative lesions, normal DP and PT pulses, normal sensory exam, and left hallux onychomycosis, over 50% improvement  Assessment:   1. Onychomycosis      Plan:  Patient was evaluated and treated and all questions answered.  Doing very well can complete course of fluconazole, continue efinaconazole twice daily until completely resolved.  She will let me know if she needs refills or if she has recurrence and needs to restart oral therapy.  Return to see me as needed.  Return if symptoms worsen or fail to improve.

## 2023-06-26 ENCOUNTER — Other Ambulatory Visit: Payer: Self-pay | Admitting: Podiatry

## 2023-07-04 ENCOUNTER — Telehealth: Payer: BC Managed Care – PPO | Admitting: Family

## 2023-07-04 DIAGNOSIS — J019 Acute sinusitis, unspecified: Secondary | ICD-10-CM

## 2023-07-04 MED ORDER — PREDNISONE 10 MG (21) PO TBPK
ORAL_TABLET | ORAL | 0 refills | Status: DC
Start: 2023-07-04 — End: 2023-12-14

## 2023-07-04 MED ORDER — DOXYCYCLINE HYCLATE 100 MG PO TABS
100.0000 mg | ORAL_TABLET | Freq: Two times a day (BID) | ORAL | 0 refills | Status: DC
Start: 2023-07-04 — End: 2023-12-14

## 2023-07-04 NOTE — Progress Notes (Signed)

## 2023-12-07 ENCOUNTER — Telehealth: Payer: Self-pay | Admitting: Family Medicine

## 2023-12-07 DIAGNOSIS — E039 Hypothyroidism, unspecified: Secondary | ICD-10-CM

## 2023-12-07 DIAGNOSIS — Z0000 Encounter for general adult medical examination without abnormal findings: Secondary | ICD-10-CM

## 2023-12-07 DIAGNOSIS — E611 Iron deficiency: Secondary | ICD-10-CM

## 2023-12-07 DIAGNOSIS — E7800 Pure hypercholesterolemia, unspecified: Secondary | ICD-10-CM

## 2023-12-07 NOTE — Telephone Encounter (Signed)
-----   Message from Lovena Neighbours sent at 11/22/2023  2:16 PM EST ----- Regarding: Labs for Friday 3.14.25 Please put lab orders in future. Thank you, Denny Peon

## 2023-12-10 ENCOUNTER — Other Ambulatory Visit: Payer: Self-pay

## 2023-12-10 DIAGNOSIS — Z0000 Encounter for general adult medical examination without abnormal findings: Secondary | ICD-10-CM | POA: Diagnosis not present

## 2023-12-10 DIAGNOSIS — E611 Iron deficiency: Secondary | ICD-10-CM

## 2023-12-10 DIAGNOSIS — E7800 Pure hypercholesterolemia, unspecified: Secondary | ICD-10-CM

## 2023-12-10 DIAGNOSIS — E039 Hypothyroidism, unspecified: Secondary | ICD-10-CM | POA: Diagnosis not present

## 2023-12-10 LAB — COMPREHENSIVE METABOLIC PANEL
ALT: 13 U/L (ref 0–35)
AST: 15 U/L (ref 0–37)
Albumin: 4.4 g/dL (ref 3.5–5.2)
Alkaline Phosphatase: 41 U/L (ref 39–117)
BUN: 26 mg/dL — ABNORMAL HIGH (ref 6–23)
CO2: 29 meq/L (ref 19–32)
Calcium: 9.4 mg/dL (ref 8.4–10.5)
Chloride: 104 meq/L (ref 96–112)
Creatinine, Ser: 0.82 mg/dL (ref 0.40–1.20)
GFR: 75.65 mL/min (ref >60.00)
Glucose, Bld: 87 mg/dL (ref 70–99)
Potassium: 4.5 meq/L (ref 3.5–5.1)
Sodium: 143 meq/L (ref 135–145)
Total Bilirubin: 0.5 mg/dL (ref 0.2–1.2)
Total Protein: 6.9 g/dL (ref 6.0–8.3)

## 2023-12-10 LAB — CBC WITH DIFFERENTIAL/PLATELET
Basophils Absolute: 0.1 10*3/uL (ref 0.0–0.1)
Basophils Relative: 1.5 % (ref 0.0–3.0)
Eosinophils Absolute: 0.2 10*3/uL (ref 0.0–0.7)
Eosinophils Relative: 3.7 % (ref 0.0–5.0)
HCT: 40.3 % (ref 36.0–46.0)
Hemoglobin: 13.5 g/dL (ref 12.0–15.0)
Lymphocytes Relative: 41.2 % (ref 12.0–46.0)
Lymphs Abs: 1.9 10*3/uL (ref 0.7–4.0)
MCHC: 33.4 g/dL (ref 30.0–36.0)
MCV: 94.6 fl (ref 78.0–100.0)
Monocytes Absolute: 0.4 10*3/uL (ref 0.1–1.0)
Monocytes Relative: 8 % (ref 3.0–12.0)
Neutro Abs: 2.1 10*3/uL (ref 1.4–7.7)
Neutrophils Relative %: 45.6 % (ref 43.0–77.0)
Platelets: 209 10*3/uL (ref 150.0–400.0)
RBC: 4.26 Mil/uL (ref 3.87–5.11)
RDW: 12.3 % (ref 11.5–15.5)
WBC: 4.6 10*3/uL (ref 4.0–10.5)

## 2023-12-10 LAB — LIPID PANEL
Cholesterol: 198 mg/dL (ref 0–200)
HDL: 63.3 mg/dL (ref >39.00)
LDL Cholesterol: 113 mg/dL — ABNORMAL HIGH (ref 0–99)
NonHDL: 135.18
Total CHOL/HDL Ratio: 3
Triglycerides: 110 mg/dL (ref 0.0–149.0)
VLDL: 22 mg/dL (ref 0.0–40.0)

## 2023-12-10 LAB — FERRITIN: Ferritin: 34.7 ng/mL (ref 10.0–291.0)

## 2023-12-10 LAB — IRON: Iron: 94 ug/dL (ref 42–145)

## 2023-12-10 LAB — TSH: TSH: 1.02 u[IU]/mL (ref 0.35–5.50)

## 2023-12-14 ENCOUNTER — Encounter: Payer: Self-pay | Admitting: Family Medicine

## 2023-12-14 ENCOUNTER — Ambulatory Visit (INDEPENDENT_AMBULATORY_CARE_PROVIDER_SITE_OTHER): Payer: 59 | Admitting: Family Medicine

## 2023-12-14 VITALS — BP 130/80 | HR 57 | Temp 97.8°F | Ht 62.0 in | Wt 129.1 lb

## 2023-12-14 DIAGNOSIS — Z0000 Encounter for general adult medical examination without abnormal findings: Secondary | ICD-10-CM | POA: Diagnosis not present

## 2023-12-14 DIAGNOSIS — E039 Hypothyroidism, unspecified: Secondary | ICD-10-CM | POA: Diagnosis not present

## 2023-12-14 DIAGNOSIS — E7800 Pure hypercholesterolemia, unspecified: Secondary | ICD-10-CM

## 2023-12-14 DIAGNOSIS — E611 Iron deficiency: Secondary | ICD-10-CM | POA: Diagnosis not present

## 2023-12-14 DIAGNOSIS — Z23 Encounter for immunization: Secondary | ICD-10-CM

## 2023-12-14 DIAGNOSIS — Z1211 Encounter for screening for malignant neoplasm of colon: Secondary | ICD-10-CM

## 2023-12-14 DIAGNOSIS — Z1283 Encounter for screening for malignant neoplasm of skin: Secondary | ICD-10-CM | POA: Insufficient documentation

## 2023-12-14 MED ORDER — POLYSACCHARIDE IRON COMPLEX 150 MG PO CAPS: 150.0000 mg | ORAL_CAPSULE | ORAL | 3 refills | Status: DC

## 2023-12-14 MED ORDER — LEVOTHYROXINE SODIUM 88 MCG PO TABS: 88.0000 ug | ORAL_TABLET | Freq: Every day | ORAL | 3 refills | Status: AC

## 2023-12-14 MED ORDER — FLUTICASONE PROPIONATE 50 MCG/ACT NA SUSP: 2.0000 | Freq: Every day | NASAL | 3 refills | Status: AC | PRN

## 2023-12-14 NOTE — Assessment & Plan Note (Addendum)
 Reviewed health habits including diet and exercise and skin cancer prevention Reviewed appropriate screening tests for age  Also reviewed health mt list, fam hx and immunization status , as well as social and family history   See HPI Labs reviewed and ordered Health Maintenance  Topic Date Due   Flu Shot  04/29/2023   Stool Blood Test  09/10/2024*   Hepatitis C Screening  12/13/2024*   HIV Screening  12/13/2024*   COVID-19 Vaccine (3 - 2024-25 season) 12/29/2024*   DTaP/Tdap/Td vaccine (3 - Td or Tdap) 01/13/2024   Mammogram  01/04/2025   Colon Cancer Screening  10/18/2028   Zoster (Shingles) Vaccine  Completed   HPV Vaccine  Aged Out  *Topic was postponed. The date shown is not the original due date.   Planning follow up with gyn and mammogram  Ref to derm for skin cancer screen/ discussed use of sun protection Discussed fall prevention, supplements and exercise for bone density  Utd oph care  PHQ 0 Flu shot today

## 2023-12-14 NOTE — Assessment & Plan Note (Signed)
Colonoscopy utd from 2020  

## 2023-12-14 NOTE — Patient Instructions (Addendum)
 Be active  Add some strength training to your routine, this is important for bone and brain health and can reduce your risk of falls and help your body use insulin properly and regulate weight  Light weights, exercise bands , and internet videos are a good way to start  Yoga (chair or regular), machines , floor exercises or a gym with machines are also good options    Work on fluid intake  Aim for 60 oz or more per day  Try flavoring your water with lemon or lime  Dilute tea is not bad  Coffee -one is ok   I put the referral in for dermatology  Please let us know if you don't hear in 1-2 weeks   Flu shot today   See gyn and get your mammogram

## 2023-12-14 NOTE — Assessment & Plan Note (Signed)
 Disc goals for lipids and reasons to control them Rev last labs with pt Rev low sat fat diet in detail Both LDL and HDL are up

## 2023-12-14 NOTE — Progress Notes (Signed)
 Subjective:    Patient ID: Ruth Gill, female    DOB: Nov 10, 1960, 63 y.o.   MRN: [id number]  HPI  Here for health maintenance exam and to review chronic medical problems   Wt Readings from Last 3 Encounters:  12/14/23 129 lb 2 oz (58.6 kg)  12/07/22 131 lb 6 oz (59.6 kg)  11/26/22 132 lb 7.9 oz (60.1 kg)   23.62 kg/m  Vitals:   12/14/23 1610  BP: 130/80  Pulse: (!) 57  Temp: 97.8 F (36.6 C)  SpO2: 96%    Immunization History  Administered Date(s) Administered   Influenza,inj,Quad PF,6+ Mos 08/04/2016, 09/01/2017, 09/07/2018, 09/11/2019, 09/11/2020, 09/24/2021, 09/07/2022   PFIZER(Purple Top)SARS-COV-2 Vaccination 12/22/2019, 01/12/2020   Td 01/27/1999   Tdap 01/12/2014   Zoster Recombinant(Shingrix) 07/26/2020, 11/08/2020    Health Maintenance Due  Topic Date Due   INFLUENZA VACCINE  04/29/2023   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  No time to exercise  Eating on the run    Mammogram 12/2022 -at gyn /is planned  Self breast exam-no lumps   Gyn health Hysterectomy in past  Takes estrace 1 mg daily  Sees gyn   Colon cancer screening  Colonoscopy 09/2018 -clear/ 10 y recall   Bone health  Dexa -not yet  Falls-none  Fractures-none  Supplements  vit D 5000iu daily  Last vitamin D Lab Results  Component Value Date   VD25OH 72.59 04/15/2021    Exercise -none while working   Sees ophy for choroidal nevus   Mood    12/07/2022    8:09 AM 10/19/2022   11:31 AM 12/03/2021   10:20 AM 09/11/2020    8:53 AM 09/11/2019    2:58 PM  Depression screen PHQ 2/9  Decreased Interest 0 0 0 0 0  Down, Depressed, Hopeless 0 0 0 0 0  PHQ - 2 Score 0 0 0 0 0  Altered sleeping 0 0 0 0 0  Tired, decreased energy 0 0 0 0 0  Change in appetite 0 0 0 0 0  Feeling bad or failure about yourself  0 0 0 0 0  Trouble concentrating 0 0 0 0 0  Moving slowly or fidgety/restless 0 0 0 0 0  Suicidal thoughts 0 0 0 0 0  PHQ-9 Score 0 0 0 0 0  Difficult doing  work/chores Not difficult at all Not difficult at all Not difficult at all Not difficult at all Not difficult at all   Hypothyroidism  Pt has no clinical changes No change in energy level/ hair or skin/ edema and no tremor Lab Results  Component Value Date   TSH 1.02 12/10/2023     Levothyroxine  88 mcg daily   Hyperlipidemia Lab Results  Component Value Date   CHOL 198 12/10/2023   CHOL 162 11/30/2022   CHOL 224 (H) 11/26/2021   Lab Results  Component Value Date   HDL 63.30 12/10/2023   HDL 48.60 11/30/2022   HDL 67.50 11/26/2021   Lab Results  Component Value Date   LDLCALC 113 (H) 12/10/2023   LDLCALC 76 11/30/2022   LDLCALC 136 (H) 11/26/2021   Lab Results  Component Value Date   TRIG 110.0 12/10/2023   TRIG 189.0 (H) 11/30/2022   TRIG 107.0 11/26/2021   Lab Results  Component Value Date   CHOLHDL 3 12/10/2023   CHOLHDL 3 11/30/2022   CHOLHDL 3 11/26/2021   No results found for: "LDLDIRECT" Diet controlled  Takes fish oil  Needs to eat less convenience foods   Iron def  Lab Results  Component Value Date   WBC 4.6 12/10/2023   HGB 13.5 12/10/2023   HCT 40.3 12/10/2023   MCV 94.6 12/10/2023   PLT 209.0 12/10/2023   Lab Results  Component Value Date   IRON 94 12/10/2023   FERRITIN 34.7 12/10/2023   Takes niferex 150 mg every other day   Lab Results  Component Value Date   NA 143 12/10/2023   K 4.5 12/10/2023   CO2 29 12/10/2023   GLUCOSE 87 12/10/2023   BUN 26 (H) 12/10/2023   CREATININE 0.82 12/10/2023   CALCIUM 9.4 12/10/2023   GFR 75.65 12/10/2023   GFRNONAA >60 11/19/2022   Lab Results  Component Value Date   ALT 13 12/10/2023   AST 15 12/10/2023   ALKPHOS 41 12/10/2023   BILITOT 0.5 12/10/2023       Patient Active Problem List   Diagnosis Date Noted   Skin cancer screening 12/14/2023   Onychomycosis 09/07/2022   Hyperlipidemia 12/03/2021   Iron deficiency 05/26/2021   Fatigue 04/15/2021   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 joints 01/22/2020   Bilateral hand pain 01/12/2020   Hypothyroid 04/13/2014   Colon cancer screening 01/12/2014   Routine general medical examination at a health care facility 01/04/2014   Allergic rhinitis 12/22/2007   Past Medical History:  Diagnosis Date   Allergic rhinitis, cause unspecified    Anemia, unspecified    Heart murmur    MVP   Hyperlipidemia    Hypothyroidism    Mastodynia    MVP (mitral valve prolapse)    Thyroid disease    hypothyroid   Toenail fungus 10/2022   Past Surgical History:  Procedure Laterality Date   ABDOMINAL HYSTERECTOMY  2004   CARPOMETACARPAL ([facility]) FUSION OF THUMB Right 11/26/2022   Procedure: CARPOMETACARPAL ([facility]) FUSION OF THUMB;  Surgeon: Deeann Saint, MD;  Location: [facility] ORS;  Service: Orthopedics;  Laterality: Right;  arthroplasty   MOUTH SURGERY     TMJ   Social History   Tobacco Use   Smoking status: Never   Smokeless tobacco: Never  Vaping Use   Vaping status: Never Used  Substance Use Topics   Alcohol use: No    Alcohol/week: 0.0 standard drinks of alcohol   Drug use: No   Family History  Problem Relation Age of Onset   Colon cancer Neg Hx    Colon polyps Neg Hx    Esophageal cancer Neg Hx    Rectal cancer Neg Hx    Stomach cancer Neg Hx    Allergies  Allergen Reactions   Azithromycin     REACTION: joint swelling, stiffness   Mobic [Meloxicam] Hives    Looks like sun burn   Penicillins     REACTION: SOB, rash   Terbinafine And Related Diarrhea and Nausea Only   Current Outpatient Medications on File Prior to Visit  Medication Sig Dispense Refill   Biotin 5000 MCG CAPS Take 1 capsule by mouth daily.     Cholecalciferol (VITAMIN D) 2000 UNITS tablet Take 4,000 Units by mouth daily.      diclofenac (VOLTAREN) 75 MG EC tablet Take 1 tablet by mouth 2 (two) times daily.     Efinaconazole 10 % SOLN Apply 1 drop topically daily. 4 mL 11   estradiol (ESTRACE) 1 MG tablet Take 1  tablet by mouth daily.     fexofenadine (ALLEGRA) 180 MG tablet Take 180 mg by  mouth every morning.     fluconazole (DIFLUCAN) 150 MG tablet TAKE 1 TABLET (150 MG TOTAL) BY MOUTH ONCE A WEEK FOR 26 DOSES. 26 tablet 0   meclizine (ANTIVERT) 12.5 MG tablet Take 1 tablet (12.5 mg total) by mouth 3 (three) times daily as needed for dizziness. 15 tablet 0   Multiple Vitamins-Minerals (WOMENS MULTI GUMMIES) CHEW Chew 2 tablets by mouth daily.     Omega-3 Fatty Acids (FISH OIL) 1200 MG CAPS Take 1 capsule by mouth daily.     No current facility-administered medications on file prior to visit.    Review of Systems  Constitutional:  Positive for fatigue. Negative for activity change, appetite change, fever and unexpected weight change.       More tired working again   HENT:  Negative for congestion, ear pain, rhinorrhea, sinus pressure and sore throat.   Eyes:  Negative for pain, redness and visual disturbance.  Respiratory:  Negative for cough, shortness of breath and wheezing.   Cardiovascular:  Negative for chest pain and palpitations.  Gastrointestinal:  Negative for abdominal pain, blood in stool, constipation and diarrhea.  Endocrine: Negative for polydipsia and polyuria.  Genitourinary:  Negative for dysuria, frequency and urgency.  Musculoskeletal:  Negative for arthralgias, back pain and myalgias.  Skin:  Negative for pallor and rash.  Allergic/Immunologic: Negative for environmental allergies.  Neurological:  Negative for dizziness, syncope and headaches.  Hematological:  Negative for adenopathy. Does not bruise/bleed easily.  Psychiatric/Behavioral:  Negative for decreased concentration and dysphoric mood. The patient is not nervous/anxious.        Objective:   Physical Exam Constitutional:      General: She is not in acute distress.    Appearance: Normal appearance. She is well-developed and normal weight. She is not ill-appearing or diaphoretic.  HENT:     Head: Normocephalic  and atraumatic.     Right Ear: Tympanic membrane, ear canal and external ear normal.     Left Ear: Tympanic membrane, ear canal and external ear normal.     Nose: Nose normal. No congestion.     Mouth/Throat:     Mouth: Mucous membranes are moist.     Pharynx: Oropharynx is clear. No posterior oropharyngeal erythema.  Eyes:     General: No scleral icterus.    Extraocular Movements: Extraocular movements intact.     Conjunctiva/sclera: Conjunctivae normal.     Pupils: Pupils are equal, round, and reactive to light.  Neck:     Thyroid: No thyromegaly.     Vascular: No carotid bruit or JVD.  Cardiovascular:     Rate and Rhythm: Normal rate and regular rhythm.     Pulses: Normal pulses.     Heart sounds: Normal heart sounds.     No gallop.  Pulmonary:     Effort: Pulmonary effort is normal. No respiratory distress.     Breath sounds: Normal breath sounds. No wheezing.     Comments: Good air exch Chest:     Chest wall: No tenderness.  Abdominal:     General: Bowel sounds are normal. There is no distension or abdominal bruit.     Palpations: Abdomen is soft. There is no mass.     Tenderness: There is no abdominal tenderness.     Hernia: No hernia is present.  Genitourinary:    Comments: Breast exam: No mass, nodules, thickening, tenderness, bulging, retraction, inflamation, nipple discharge or skin changes noted.  No axillary or clavicular LA.  Musculoskeletal:        General: No tenderness. Normal range of motion.     Cervical back: Normal range of motion and neck supple. No rigidity. No muscular tenderness.     Right lower leg: No edema.     Left lower leg: No edema.     Comments: No kyphosis   Lymphadenopathy:     Cervical: No cervical adenopathy.  Skin:    General: Skin is warm and dry.     Coloration: Skin is not pale.     Findings: No erythema or rash.     Comments: Multiple macular brown nevi on back / under 5 mm /some irregular  SK on right upper forehead    Keratotic lesion near inner corner of right eye   Neurological:     Mental Status: She is alert. Mental status is at baseline.     Cranial Nerves: No cranial nerve deficit.     Motor: No abnormal muscle tone.     Coordination: Coordination normal.     Gait: Gait normal.     Deep Tendon Reflexes: Reflexes are normal and symmetric.  Psychiatric:        Mood and Affect: Mood normal.        Cognition and Memory: Cognition and memory normal.           Assessment & Plan:   Problem List Items Addressed This Visit       Endocrine   Hypothyroid   Hypothyroidism  Pt has no clinical changes No change in energy level/ hair or skin/ edema and no tremor Lab Results  Component Value Date   TSH 1.02 12/10/2023    Continues levothyroxine 88 mcg daily      Relevant Medications   levothyroxine (SYNTHROID) 88 MCG tablet     Other   Skin cancer screening   Relevant Orders   Ambulatory referral to Dermatology   Routine general medical examination at a health care facility - Primary   Reviewed health habits including diet and exercise and skin cancer prevention Reviewed appropriate screening tests for age  Also reviewed health mt list, fam hx and immunization status , as well as social and family history   See HPI Labs reviewed and ordered Health Maintenance  Topic Date Due   Flu Shot  04/29/2023   Stool Blood Test  09/10/2024*   Hepatitis C Screening  12/13/2024*   HIV Screening  12/13/2024*   COVID-19 Vaccine (3 - 2024-25 season) 12/29/2024*   DTaP/Tdap/Td vaccine (3 - Td or Tdap) 01/13/2024   Mammogram  01/04/2025   Colon Cancer Screening  10/18/2028   Zoster (Shingles) Vaccine  Completed   HPV Vaccine  Aged Out  *Topic was postponed. The date shown is not the original due date.   Planning follow up with gyn and mammogram  Ref to derm for skin cancer screen/ discussed use of sun protection Discussed fall prevention, supplements and exercise for bone density  Utd oph  care  PHQ 0 Flu shot today        Iron deficiency   Stable with niferex every other day  Lab Results  Component Value Date   IRON 94 12/10/2023   FERRITIN 34.7 12/10/2023         Hyperlipidemia   Disc goals for lipids and reasons to control them Rev last labs with pt Rev low sat fat diet in detail Both LDL and HDL are up         Colon cancer  screening   Colonoscopy utd from 2020

## 2023-12-14 NOTE — Assessment & Plan Note (Signed)
 Stable with niferex every other day  Lab Results  Component Value Date   IRON 94 12/10/2023   FERRITIN 34.7 12/10/2023

## 2023-12-14 NOTE — Assessment & Plan Note (Signed)
 Hypothyroidism  Pt has no clinical changes No change in energy level/ hair or skin/ edema and no tremor Lab Results  Component Value Date   TSH 1.02 12/10/2023    Continues levothyroxine 88 mcg daily

## 2024-01-10 ENCOUNTER — Ambulatory Visit: Payer: Self-pay

## 2024-01-10 NOTE — Telephone Encounter (Signed)
 Will route to referral dpt to check status of referral

## 2024-01-10 NOTE — Telephone Encounter (Signed)
 Copied from CRM [id number]. Topic: Clinical - Medical Advice >> Jan 10, 2024  8:37 AM Marissa P wrote: Reason for CRM: Patient called in because there was suppose to be a referral sent from Dr. Malissa Se to the dermatologist and she did advise that its been a little over 3 weeks and she has not heard back from the dermatologist yet so she is following up. Patient would also like if someone can contact her back in regards to this. Did advise patient that all nurses are with someone at the moment. Callback# [phone]

## 2024-01-11 ENCOUNTER — Encounter: Payer: Self-pay | Admitting: *Deleted

## 2024-01-11 ENCOUNTER — Telehealth: Payer: Self-pay | Admitting: Family Medicine

## 2024-01-11 NOTE — Telephone Encounter (Signed)
 Looking at referral, it looks like the referral is set to do not schedule as the office is not accepting new patients right now. Informed patient of this information and she just want to go somewhere in the [place] area. Thank you!   Copied from CRM [id number]. Topic: Clinical - Medical Advice >> Jan 10, 2024  8:37 AM Ruth Gill wrote: Reason for CRM: Patient called in because there was suppose to be a referral sent from Dr. Malissa Se to the dermatologist and she did advise that its been a little over 3 weeks and she has not heard back from the dermatologist yet so she is following up. Patient would also like if someone can contact her back in regards to this. Did advise patient that all nurses are with someone at the moment. Callback# [phone] >> Jan 11, 2024  8:47 AM Ruth Gill wrote: Pt called back to check status of the request. MyChart message told her that a nurse called her yesterday but she did not get any missed calls. Per referral that was put in, office not taking any new patients, referral denied. Patient would like new referral for somewhere in [place] asap and would like a call back with a status update. Thank You

## 2024-01-11 NOTE — Telephone Encounter (Signed)
 See other telephone encounter and Mychart message  Referral rerouted

## 2024-01-22 ENCOUNTER — Ambulatory Visit
Admission: EM | Admit: 2024-01-22 | Discharge: 2024-01-22 | Disposition: A | Discharge status: Home / Self Care | Attending: Emergency Medicine | Admitting: Emergency Medicine

## 2024-01-22 DIAGNOSIS — J020 Streptococcal pharyngitis: Secondary | ICD-10-CM

## 2024-01-22 LAB — POCT RAPID STREP A (OFFICE): Rapid Strep A Screen: POSITIVE — AB

## 2024-01-22 MED ORDER — DOXYCYCLINE HYCLATE 100 MG PO CAPS: 100.0000 mg | ORAL_CAPSULE | Freq: Two times a day (BID) | ORAL | 0 refills | Status: DC

## 2024-01-22 MED ORDER — CEFDINIR 300 MG PO CAPS
300.0000 mg | ORAL_CAPSULE | Freq: Two times a day (BID) | ORAL | 0 refills | Status: DC
Start: 2024-01-22 — End: 2024-01-22

## 2024-01-22 NOTE — ED Triage Notes (Signed)
 Pt c/o sore throat x3days  Pt states that it became worse last night.

## 2024-01-22 NOTE — Discharge Instructions (Addendum)
 Your rapid strep test today was positive  Take doxycyline  twice a day for the next 10 days, daily will see improvement in about 48 hours and steady progression from there  may use of salt gargles throat lozenges, warm liquids, teaspoons of honey and over-the-counter clippers septic spray for comfort  May give Tylenol  or Motrin every 6 hours as needed for additional comfort  You may follow-up at urgent care as needed

## 2024-01-22 NOTE — ED Provider Notes (Signed)
 Ruth Gill    CSN: [id number] Arrival date & time: 01/22/24  4098      History   Chief Complaint Chief Complaint  Patient presents with   Sore Throat          HPI Ruth Gill is a 63 y.o. female.   Patient presents for evaluation of a sore throat beginning 3 days ago, symptoms worsening overnight.  Able to tolerate food and liquids.  Denies fever, congestion, ear pain or cough.  Has attempted use of salt water gargles, hot lemon honey tea and Tylenol .  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  Past Medical History:  Diagnosis Date   Allergic rhinitis, cause unspecified    Anemia, unspecified    Heart murmur    MVP   Hyperlipidemia    Hypothyroidism    Mastodynia    MVP (mitral valve prolapse)    Thyroid  disease    hypothyroid   Toenail fungus 10/2022    Patient Active Problem List   Diagnosis Date Noted   Skin cancer screening 12/14/2023   Onychomycosis 09/07/2022   Hyperlipidemia 12/03/2021   Iron  deficiency 05/26/2021   Fatigue 04/15/2021   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 01/22/2020   Bilateral hand pain 01/12/2020   Hypothyroid 04/13/2014   Colon cancer screening 01/12/2014   Routine general medical examination at a health care facility 01/04/2014   Allergic rhinitis 12/22/2007    Past Surgical History:  Procedure Laterality Date   ABDOMINAL HYSTERECTOMY  2004   CARPOMETACARPAL [facility]) FUSION OF THUMB Right 11/26/2022   Procedure: CARPOMETACARPAL [facility]) FUSION OF THUMB;  Surgeon: Marlynn Singer, MD;  Location: [facility] ORS;  Service: Orthopedics;  Laterality: Right;  arthroplasty   MOUTH SURGERY     TMJ    OB History   No obstetric history on file.      Home Medications    Prior to Admission medications   Medication Sig Start Date End Date Taking? Authorizing Provider  Biotin 5000 MCG CAPS Take 1 capsule by mouth daily.   Yes [provider]  Cholecalciferol (VITAMIN D) 2000  UNITS tablet Take 4,000 Units by mouth daily.    Yes [provider]  diclofenac  (VOLTAREN ) 75 MG EC tablet Take 1 tablet by mouth 2 (two) times daily. 07/18/20  Yes [provider]  doxycycline  (VIBRAMYCIN ) 100 MG capsule Take 1 capsule (100 mg total) by mouth 2 (two) times daily. 01/22/24  Yes Rosamary Boudreau R, NP  Efinaconazole  10 % SOLN Apply 1 drop topically daily. 01/11/23  Yes McDonald, Olive Better, DPM  estradiol (ESTRACE) 1 MG tablet Take 1 tablet by mouth daily. 08/09/11  Yes [provider]  fexofenadine (ALLEGRA) 180 MG tablet Take 180 mg by mouth every morning.   Yes [provider]  fluticasone  (FLONASE ) 50 MCG/ACT nasal spray Place 2 sprays into both nostrils daily as needed. 12/14/23  Yes Tower, Manley Seeds, MD  iron  polysaccharides (NIFEREX) 150 MG capsule Take 1 capsule (150 mg total) by mouth every other day. 12/14/23  Yes Tower, Manley Seeds, MD  levothyroxine  (SYNTHROID ) 88 MCG tablet Take 1 tablet (88 mcg total) by mouth daily before breakfast. 12/14/23  Yes Tower, Manley Seeds, MD  meclizine  (ANTIVERT ) 12.5 MG tablet Take 1 tablet (12.5 mg total) by mouth 3 (three) times daily as needed for dizziness. 07/19/18  Yes Guadalupe Lee, MD  Multiple Vitamins-Minerals (WOMENS MULTI GUMMIES) CHEW Chew 2 tablets by mouth daily.   Yes [provider]  Omega-3 Fatty Acids (FISH OIL) 1200 MG CAPS Take 1 capsule by mouth daily.   Yes [provider]    Family History Family History  Problem Relation Age of Onset   Colon cancer Neg Hx    Colon polyps Neg Hx    Esophageal cancer Neg Hx    Rectal cancer Neg Hx    Stomach cancer Neg Hx     Social History Social History   Tobacco Use   Smoking status: Never   Smokeless tobacco: Never  Vaping Use   Vaping status: Never Used  Substance Use Topics   Alcohol use: No    Alcohol/week: 0.0 standard drinks of alcohol   Drug use: No     Allergies   Azithromycin, Mobic [meloxicam], Penicillins, and  Terbinafine  and related   Review of Systems Review of Systems   Physical Exam Triage Vital Signs ED Triage Vitals [01/22/24 0830]  Encounter Vitals Group     BP (!) 173/108     Systolic BP Percentile      Diastolic BP Percentile      Pulse Rate 77     Resp      Temp 99.2 F (37.3 C)     Temp Source Oral     SpO2 94 %     Weight 129 lb (58.5 kg)     Height 5\' 3"  (1.6 m)     Head Circumference      Peak Flow      Pain Score 0     Pain Loc      Pain Education      Exclude from Growth Chart    No data found.  Updated Vital Signs BP (!) 173/108 (BP Location: Left Arm)   Pulse 77   Temp 99.2 F (37.3 C) (Oral)   Ht 5\' 3"  (1.6 m)   Wt 129 lb (58.5 kg)   SpO2 94%   BMI 22.85 kg/m   Visual Acuity Right Eye Distance:   Left Eye Distance:   Bilateral Distance:    Right Eye Near:   Left Eye Near:    Bilateral Near:     Physical Exam Constitutional:      Appearance: Normal appearance. She is well-developed.  HENT:     Head: Normocephalic.     Mouth/Throat:     Pharynx: Posterior oropharyngeal erythema present. No oropharyngeal exudate.     Tonsils: No tonsillar exudate. 3+ on the right. 3+ on the left.  Pulmonary:     Effort: Pulmonary effort is normal.  Musculoskeletal:     Cervical back: Normal range of motion and neck supple.  Neurological:     Mental Status: She is alert and oriented to person, place, and time.      UC Treatments / Results  Labs (all labs ordered are listed, but only abnormal results are displayed) Labs Reviewed  POCT RAPID STREP A (OFFICE) - Abnormal; Notable for the following components:      Result Value   Rapid Strep A Screen Positive (*)    All other components within normal limits    EKG   Radiology No results found.  Procedures Procedures (including critical care time)  Medications Ordered in UC Medications - No data to display  Initial Impression / Assessment and Plan / UC Course  I have reviewed the triage  vital signs and the nursing notes.  Pertinent labs & imaging results that were available during my care of the patient were reviewed by me and  considered in my medical decision making (see chart for details).  Strep pharyngitis  Rapid testing positive, discussed with patient, doxycyline 10-day course prescribed, may attempt salt water gargles, throat lozenges, warm to cool liquids per preference, over-the-counter Chloraseptic spray and over-the-counter analgesics for management of discomfort, may follow-up with urgent care as needed if symptoms persist or worsen, note given  Final Clinical Impressions(s) / UC Diagnoses   Final diagnoses:  Strep pharyngitis     Discharge Instructions      Your rapid strep test today was positive  Take doxycyline  twice a day for the next 10 days, daily will see improvement in about 48 hours and steady progression from there  may use of salt gargles throat lozenges, warm liquids, teaspoons of honey and over-the-counter clippers septic spray for comfort  May give Tylenol  or Motrin every 6 hours as needed for additional comfort  You may follow-up at urgent care as needed      ED Prescriptions     Medication Sig Dispense Auth. Provider   cefdinir (OMNICEF) 300 MG capsule  (Status: Discontinued) Take 1 capsule (300 mg total) by mouth 2 (two) times daily for 10 days. 20 capsule Malvern Kadlec R, NP   doxycycline  (VIBRAMYCIN ) 100 MG capsule Take 1 capsule (100 mg total) by mouth 2 (two) times daily. 20 capsule Mitchelle Goerner R, NP      PDMP not reviewed this encounter.   Reena Canning, [place] 01/22/24 [phone]

## 2024-02-04 ENCOUNTER — Telehealth: Payer: Self-pay | Admitting: *Deleted

## 2024-02-04 NOTE — Telephone Encounter (Signed)
 Pt notified of Dr. Belva Boyden comments. Pt said her strep throat sxs are better and have resolved. Yesterday pt started with a runny nose and today she has congestion and ear pressure and a HA. Pt advised to go to UC for eval since office is closed on the weekend

## 2024-02-04 NOTE — Telephone Encounter (Signed)
 Copied from CRM [id number]. Topic: Clinical - Medical Advice >> Feb 04, 2024  1:29 PM Ruth Gill wrote: Reason for CRM: Patient called in stated she she went to urgent care a week ago and was diagnosed with strep throat, took the anabiotics and finished them and now she is still not feeling good. Nose runny and congested . Wanted to know if a nurse could call her in something

## 2024-02-04 NOTE — Telephone Encounter (Signed)
 Needs in office follow up or UC if needed  Runny nose/congestion do not usually go with strep throat - may be allergies or a cold and over the counter antihistamine is usually most helpful  Is sore throat/strep improving ?

## 2024-02-09 ENCOUNTER — Ambulatory Visit: Payer: Self-pay | Admitting: Family Medicine

## 2024-02-09 NOTE — Telephone Encounter (Signed)
 Will see her then Agree with UC /ER precautions

## 2024-02-09 NOTE — Telephone Encounter (Signed)
 Nurse scheduled appt on 02/10/24 with PCP

## 2024-02-09 NOTE — Telephone Encounter (Signed)
 Chief Complaint: Congestion since last Thursday  Symptoms: Congestion in right ear, pain on right side of neck with touch, dry cough Pertinent Negatives: Patient denies fever, sore throat, difficulty breathing  Disposition: [x] Appointment (In office)  Additional Notes: Patient scheduled for appointment for tomorrow with PCP in office. This RN educated pt on new-worsening symptoms and when to call back/seek emergent care. Pt verbalized understanding and agrees to plan.    Copied from CRM [id number]. Topic: Clinical - Red Word Triage >> Feb 09, 2024 10:51 AM Clydene Darner H wrote: Red Word that prompted transfer to Nurse Triage: Patient called reporting symptoms consistent with a possible viral illness (bug). She stated she has been experiencing congestion and right ear pain since last week, to the point that she can barely hear. She also reported pain in her neck when touched. Reason for Disposition  Earache  Answer Assessment - Initial Assessment Questions LOCATION: "Where does it hurt?"      Pain in neck with touch ONSET: "When did the sinus pain start?"  (e.g., hours, days)      Thursday SEVERITY: "How bad is the pain?"   (Scale 1-10; mild, moderate or severe)   - MILD (1-3): doesn't interfere with normal activities    - MODERATE (4-7): interferes with normal activities (e.g., work or school) or awakens from sleep   - SEVERE (8-10): excruciating pain and patient unable to do any normal activities        3-4/10 NASAL CONGESTION: "Is the nose blocked?" If Yes, ask: "Can you open it or must you breathe through your mouth?"     Some NASAL DISCHARGE: "Do you have discharge from your nose?" If so ask, "What color?"     Clear FEVER: "Do you have a fever?" If Yes, ask: "What is it, how was it measured, and when did it start?"      No OTHER SYMPTOMS: "Do you have any other symptoms?" (e.g., sore throat, cough, earache, difficulty breathing)     Dry cough  Protocols used: Sinus Pain or  Congestion-A-AH

## 2024-02-10 ENCOUNTER — Encounter: Payer: Self-pay | Admitting: Family Medicine

## 2024-02-10 ENCOUNTER — Ambulatory Visit: Admitting: Family Medicine

## 2024-02-10 VITALS — BP 134/84 | HR 70 | Temp 98.2°F | Ht 63.0 in | Wt 130.2 lb

## 2024-02-10 DIAGNOSIS — H6690 Otitis media, unspecified, unspecified ear: Secondary | ICD-10-CM | POA: Insufficient documentation

## 2024-02-10 DIAGNOSIS — H6501 Acute serous otitis media, right ear: Secondary | ICD-10-CM | POA: Diagnosis not present

## 2024-02-10 MED ORDER — CLINDAMYCIN HCL 300 MG PO CAPS: 300.0000 mg | ORAL_CAPSULE | Freq: Three times a day (TID) | ORAL | 0 refills | Status: AC

## 2024-02-10 NOTE — Assessment & Plan Note (Signed)
 After a week of nasal congestion with clear mucous  Right ear-pain and erythema on exam  Pt is allergic to pcn and significant reaction to azithro in past  Will treat with clindamycin  tid for 7d (discussed pot side effects like diarrhea) Disc symptomatic care - see instructions on AVS  An After Visit Summary was printed and given to the patient. Handout on OM  Update if not starting to improve in a week or if worsening  Call back and Er precautions noted in detail today

## 2024-02-10 NOTE — Progress Notes (Signed)
 p  Subjective:    Patient ID: Ruth Gill, female    DOB: July 18, 1961, 63 y.o.   MRN: [id number]  HPI  Wt Readings from Last 3 Encounters:  02/10/24 130 lb 4 oz (59.1 kg)  01/22/24 129 lb (58.5 kg)  12/14/23 129 lb 2 oz (58.6 kg)   23.07 kg/m  Vitals:   02/10/24 0849  BP: 134/84  Pulse: 70  Temp: 98.2 F (36.8 C)  SpO2: 97%   Pt presents for right ear pain  Head congestion  Started on 5/8 - congestion / no fever or body aches ? 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   Patient Active Problem List   Diagnosis Date Noted   Otitis media 02/10/2024   Skin cancer screening 12/14/2023   Onychomycosis 09/07/2022   Hyperlipidemia 12/03/2021   Iron  deficiency 05/26/2021   Fatigue 04/15/2021   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 01/22/2020   Bilateral hand pain 01/12/2020   Hypothyroid 04/13/2014   Colon cancer screening 01/12/2014   Routine general medical examination at a health care facility 01/04/2014   Allergic rhinitis 12/22/2007   Past Medical History:  Diagnosis Date   Allergic rhinitis, cause unspecified    Anemia, unspecified    Heart murmur    MVP   Hyperlipidemia    Hypothyroidism    Mastodynia    MVP (mitral valve prolapse)    Thyroid  disease    hypothyroid   Toenail fungus 10/2022   Past Surgical History:  Procedure Laterality Date   ABDOMINAL HYSTERECTOMY  2004   CARPOMETACARPAL (CMC) FUSION OF THUMB Right 11/26/2022   Procedure: CARPOMETACARPAL (CMC) FUSION OF THUMB;  Surgeon: Marlynn Singer, MD;   Location: [facility] ORS;  Service: Orthopedics;  Laterality: Right;  arthroplasty   MOUTH SURGERY     TMJ   Social History   Tobacco Use   Smoking status: Never   Smokeless tobacco: Never  Vaping Use   Vaping status: Never Used  Substance Use Topics   Alcohol use: No    Alcohol/week: 0.0 standard drinks of alcohol   Drug use: No   Family History  Problem Relation Age of Onset   Colon cancer Neg Hx    Colon polyps Neg Hx    Esophageal cancer Neg Hx    Rectal cancer Neg Hx    Stomach cancer Neg Hx    Allergies  Allergen Reactions   Azithromycin     REACTION: joint swelling, stiffness   Mobic [Meloxicam] Hives    Looks like sun burn   Penicillins     REACTION: SOB, rash   Terbinafine  And Related Diarrhea and Nausea Only   Current Outpatient Medications on File Prior to Visit  Medication Sig Dispense Refill   Biotin 5000 MCG CAPS Take 1 capsule by mouth daily.     Cholecalciferol (VITAMIN D) 2000 UNITS tablet Take 4,000 Units by mouth daily.      diclofenac  (VOLTAREN ) 75 MG EC tablet Take 1 tablet by mouth  2 (two) times daily.     doxycycline  (VIBRAMYCIN ) 100 MG capsule Take 1 capsule (100 mg total) by mouth 2 (two) times daily. 20 capsule 0   Efinaconazole  10 % SOLN Apply 1 drop topically daily. 4 mL 11   estradiol (ESTRACE) 1 MG tablet Take 1 tablet by mouth daily.     fexofenadine (ALLEGRA) 180 MG tablet Take 180 mg by mouth every morning.     fluticasone  (FLONASE ) 50 MCG/ACT nasal spray Place 2 sprays into both nostrils daily as needed. 48 mL 3   iron  polysaccharides (NIFEREX) 150 MG capsule Take 1 capsule (150 mg total) by mouth every other day. 45 capsule 3   levothyroxine  (SYNTHROID ) 88 MCG tablet Take 1 tablet (88 mcg total) by mouth daily before breakfast. 90 tablet 3   meclizine  (ANTIVERT ) 12.5 MG tablet Take 1 tablet (12.5 mg total) by mouth 3 (three) times daily as needed for dizziness. 15 tablet 0   Multiple Vitamins-Minerals (WOMENS MULTI GUMMIES) CHEW Chew 2  tablets by mouth daily.     Omega-3 Fatty Acids (FISH OIL) 1200 MG CAPS Take 1 capsule by mouth daily.     No current facility-administered medications on file prior to visit.    Review of Systems     Objective:   Physical Exam Constitutional:      General: She is not in acute distress.    Appearance: Normal appearance. She is well-developed and normal weight. She is not ill-appearing, toxic-appearing or diaphoretic.  HENT:     Head: Normocephalic and atraumatic.     Comments: Nares are injected and congested    No sinus tenderness    Right Ear: Ear canal and external ear normal.     Left Ear: Tympanic membrane, ear canal and external ear normal.     Ears:     Comments: Right TM is erythematous and retracted   Scant cerumen in both canals     Nose: Congestion and rhinorrhea present.     Mouth/Throat:     Mouth: Mucous membranes are moist.     Pharynx: Oropharynx is clear. No oropharyngeal exudate or posterior oropharyngeal erythema.     Comments: Clear pnd  Eyes:     General:        Right eye: No discharge.        Left eye: No discharge.     Conjunctiva/sclera: Conjunctivae normal.     Pupils: Pupils are equal, round, and reactive to light.  Cardiovascular:     Rate and Rhythm: Normal rate.     Heart sounds: Normal heart sounds.  Pulmonary:     Effort: Pulmonary effort is normal. No respiratory distress.     Breath sounds: Normal breath sounds. No stridor. No wheezing, rhonchi or rales.  Chest:     Chest wall: No tenderness.  Musculoskeletal:     Cervical back: Normal range of motion and neck supple.  Lymphadenopathy:     Cervical: No cervical adenopathy.  Skin:    General: Skin is warm and dry.     Capillary Refill: Capillary refill takes less than 2 seconds.     Findings: No rash.  Neurological:     Mental Status: She is alert.     Cranial Nerves: No cranial nerve deficit.  Psychiatric:        Mood and Affect: Mood normal.           Assessment & Plan:    Problem List Items Addressed This Visit  Nervous and Auditory   Otitis media - Primary   After a week of nasal congestion with clear mucous  Right ear-pain and erythema on exam  Pt is allergic to pcn and significant reaction to azithro in past  Will treat with clindamycin  tid for 7d (discussed pot side effects like diarrhea) Disc symptomatic care - see instructions on AVS  An After Visit Summary was printed and given to the patient. Handout on OM  Update if not starting to improve in a week or if worsening  Call back and Er precautions noted in detail today         Relevant Medications   clindamycin  (CLEOCIN ) 300 MG capsule

## 2024-02-10 NOTE — Patient Instructions (Addendum)
 Increase flonase  to 2 sprays per nostril twice daily for a week Plain mucinex is ok  Tylenol  as needed  Lots of fluids   Take clindamycin  three times daily for a week Take with food  If diarrhea persists beyond the medicine let us  know    Update if not starting to improve in a week or if worsening

## 2024-02-11 ENCOUNTER — Ambulatory Visit: Payer: Self-pay

## 2024-02-11 NOTE — Telephone Encounter (Signed)
 That could indicate either some soft /liquid ear wax or it could be from a rupture of the ear drum which sometimes happens with ear infections and is ok  Glad the pain is not worse   Continue the antibiotic  Let us  know next week if symptoms do no improve or earlier if they worsen Thanks

## 2024-02-11 NOTE — Telephone Encounter (Signed)
 Copied from CRM [id number]. Topic: Clinical - Red Word Triage >> Feb 11, 2024  2:37 PM Adonis Hoot wrote: Red Word that prompted transfer to Nurse Triage: red/clear drainage from ear   Chief Complaint: Drainage from ear  Symptoms: Drainage from right ear  Pertinent Negatives: Patient denies any worsening pain or decreased hearing compared to visit yesterday  Disposition: [] ED /[] Urgent Care (no appt availability in office) / [] Appointment(In office/virtual)/ []  [facility]/ [x] Home Care/ [] Refused Recommended Disposition /[] [facility]/ []  Follow-up with PCP Additional Notes: Patient reports she was seen yesterday for right ear pain and was prescribed Clindamycin  for an ear infection. Patient states that today she noticed some clear/reddish drainage from her ear. She denies any new or worsening pain. She states her hearing is muffled from that ear but that it is no worse than it was yesterday when she was seen. Patient advised that I would make the office aware of her symptoms and that she should continue the antibiotic and to call back if the ear drainage worsens or if there is any blood coming from her ear. Patient understood and is agreeable with this plan.     Reason for Disposition  Yellow or cloudy discharge from ear canal  Answer Assessment - Initial Assessment Questions 1. LOCATION: "Which ear is involved?"      Right ear  2. COLOR: "What is the color of the discharge?"      Red/clear  3. CONSISTENCY: "How runny is the discharge? Could it be water?"      Watery  4. ONSET: "When did you first notice the discharge?"     Today  5. PAIN: "Is there any earache?" "How bad is it?"  (Scale 1-10; or mild, moderate, severe)     Mild pain  6. OBJECTS: "Have you put anything in your ear?" (e.g., Q-tip, other object)      No 7. OTHER SYMPTOMS: "Do you have any other symptoms?" (e.g., headache, fever, dizziness, vomiting, runny nose)     Decreased hearing out of ear  Answer  Assessment - Initial Assessment Questions 1. ANTIBIOTIC: "What antibiotic are you taking?" "How many times per day?"     Clindamycin   2. ONSET: "When was the antibiotic started?"     Yesterday  3. LOCATION: "Which ear is involved?"     Right ear  4. PAIN: "How bad is the pain?"   (Scale 1-10; mild, moderate or severe)   - MILD (1-3): doesn't interfere with normal activities    - MODERATE (4-7): interferes with normal activities or awakens from sleep    - SEVERE (8-10): excruciating pain, unable to do any normal activities      Mild  5. FEVER: "Do you have a fever?" If Yes, ask: "What is your temperature, how was it measured, and when did it start?"     No 6. DISCHARGE: "Is there any discharge from the ear?"     Yes, reddish clear  7. OTHER SYMPTOMS: "Do you have any other symptoms?" (e.g., headache, stiff neck, dizziness, vomiting, runny nose)     Decreased hearing out of right ear  Protocols used: Ear - Discharge-A-AH, Ear - Otitis Media Follow-up Call-A-AH

## 2024-02-11 NOTE — Telephone Encounter (Signed)
Pt notified of Dr. Tower's comments and verbalized understanding  

## 2024-02-29 ENCOUNTER — Ambulatory Visit (INDEPENDENT_AMBULATORY_CARE_PROVIDER_SITE_OTHER): Admitting: Family Medicine

## 2024-02-29 ENCOUNTER — Encounter: Payer: Self-pay | Admitting: Family Medicine

## 2024-02-29 VITALS — BP 126/80 | HR 59 | Temp 98.3°F | Ht 63.0 in | Wt 128.5 lb

## 2024-02-29 DIAGNOSIS — H6121 Impacted cerumen, right ear: Secondary | ICD-10-CM

## 2024-02-29 DIAGNOSIS — H6991 Unspecified Eustachian tube disorder, right ear: Secondary | ICD-10-CM

## 2024-02-29 DIAGNOSIS — H6120 Impacted cerumen, unspecified ear: Secondary | ICD-10-CM | POA: Insufficient documentation

## 2024-02-29 DIAGNOSIS — H6990 Unspecified Eustachian tube disorder, unspecified ear: Secondary | ICD-10-CM | POA: Insufficient documentation

## 2024-02-29 NOTE — Patient Instructions (Signed)
 Let the water drain out of your ear over the next few days and hearing should improve   Increase flonase  to 2 sprays in each nostril twice daily for a week  Then go back to one spray  This is to help open up the inside ear tube   Then if not improving get debrox ear solution over the counter and use it twice weekly in right ear to loosen any remaining ear wax   Update if not starting to improve in a week or if worsening

## 2024-02-29 NOTE — Assessment & Plan Note (Addendum)
 Right ear-mild  Treated with simple ear irrigation today-small amount of cerumen moved and able to better see TM   S/p OM treated successfully with clindamycin  Hearing is muffled   Encouraged use of debrox over the counter as needed  Call back and Er precautions noted in detail today

## 2024-02-29 NOTE — Assessment & Plan Note (Signed)
 S/p OM  Some muffled hearing  Reassuring exam did also remove some cerumen)  Instructed to increase flonase  to 2 sprays each nostril bid for one week then return to one spray   Update if not starting to improve in a week or if worsening  May need prednisone  if not better  Call back and Er precautions noted in detail today

## 2024-02-29 NOTE — Progress Notes (Signed)
 Subjective:    Patient ID: Ruth Gill, female    DOB: [birth year], 63 y.o.   MRN: [id number]  HPI  Wt Readings from Last 3 Encounters:  02/29/24 128 lb 8 oz (58.3 kg)  02/10/24 130 lb 4 oz (59.1 kg)  01/22/24 129 lb (58.5 kg)   22.76 kg/m  Vitals:   02/29/24 1355  BP: 126/80  Pulse: (!) 59  Temp: 98.3 F (36.8 C)  SpO2: 98%    Pt presents for hearing problem/ discomfort in right ear   Was treated with clindamycin  for OM on 5/15  (all to pcn and intol to azithro in past)  Had congestion as well   Pain is better  Feels plugged up /cannot hear  Some pressure sensation  Occational headache  Takes tylenol    Uses flonase  daily 1 spray each nostril twice daily   Takes allegra  No longer has nasal congestion  No fever  No aches   No neck pain  No rash        Patient Active Problem List   Diagnosis Date Noted   Cerumen impaction 02/29/2024   ETD (eustachian tube dysfunction) 02/29/2024   Otitis media 02/10/2024   Skin cancer screening 12/14/2023   Onychomycosis 09/07/2022   Hyperlipidemia 12/03/2021   Iron  deficiency 05/26/2021   Fatigue 04/15/2021   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 01/22/2020   Bilateral hand pain 01/12/2020   Hypothyroid 04/13/2014   Colon cancer screening 01/12/2014   Routine general medical examination at a health care facility 01/04/2014   Allergic rhinitis 12/22/2007   Past Medical History:  Diagnosis Date   Allergic rhinitis, cause unspecified    Anemia, unspecified    Heart murmur    MVP   Hyperlipidemia    Hypothyroidism    Mastodynia    MVP (mitral valve prolapse)    Thyroid  disease    hypothyroid   Toenail fungus 10/2022   Past Surgical History:  Procedure Laterality Date   ABDOMINAL HYSTERECTOMY  2004   CARPOMETACARPAL (CMC) FUSION OF THUMB Right 11/26/2022   Procedure: CARPOMETACARPAL (CMC) FUSION OF THUMB;  Surgeon: Marlynn Singer, MD;  Location: [facility];  Service:  Orthopedics;  Laterality: Right;  arthroplasty   MOUTH SURGERY     TMJ   Social History   Tobacco Use   Smoking status: Never   Smokeless tobacco: Never  Vaping Use   Vaping status: Never Used  Substance Use Topics   Alcohol use: No    Alcohol/week: 0.0 standard drinks of alcohol   Drug use: No   Family History  Problem Relation Age of Onset   Colon cancer Neg Hx    Colon polyps Neg Hx    Esophageal cancer Neg Hx    Rectal cancer Neg Hx    Stomach cancer Neg Hx    Allergies  Allergen Reactions   Azithromycin     REACTION: joint swelling, stiffness   Mobic [Meloxicam] Hives    Looks like sun burn   Penicillins     REACTION: SOB, rash   Terbinafine  And Related Diarrhea and Nausea Only   Current Outpatient Medications on File Prior to Visit  Medication Sig Dispense Refill   Biotin 5000 MCG CAPS Take 1 capsule by mouth daily.     Cholecalciferol (VITAMIN D) 2000 UNITS tablet Take 4,000 Units by mouth daily.      diclofenac  (VOLTAREN ) 75 MG EC tablet Take 1 tablet by mouth 2 (two) times daily.  Efinaconazole  10 % SOLN Apply 1 drop topically daily. 4 mL 11   estradiol (ESTRACE) 1 MG tablet Take 1 tablet by mouth daily.     fexofenadine (ALLEGRA) 180 MG tablet Take 180 mg by mouth every morning.     fluticasone  (FLONASE ) 50 MCG/ACT nasal spray Place 2 sprays into both nostrils daily as needed. 48 mL 3   iron  polysaccharides (NIFEREX) 150 MG capsule Take 1 capsule (150 mg total) by mouth every other day. 45 capsule 3   levothyroxine  (SYNTHROID ) 88 MCG tablet Take 1 tablet (88 mcg total) by mouth daily before breakfast. 90 tablet 3   meclizine  (ANTIVERT ) 12.5 MG tablet Take 1 tablet (12.5 mg total) by mouth 3 (three) times daily as needed for dizziness. 15 tablet 0   Multiple Vitamins-Minerals (WOMENS MULTI GUMMIES) CHEW Chew 2 tablets by mouth daily.     Omega-3 Fatty Acids (FISH OIL) 1200 MG CAPS Take 1 capsule by mouth daily.     No current facility-administered  medications on file prior to visit.    Review of Systems  Constitutional:  Negative for activity change, appetite change, fatigue, fever and unexpected weight change.  HENT:  Positive for hearing loss and sinus pressure. Negative for congestion, ear discharge, ear pain, facial swelling, postnasal drip, rhinorrhea, sinus pain, sore throat, tinnitus and trouble swallowing.   Eyes:  Negative for pain, redness and visual disturbance.  Respiratory:  Negative for cough, shortness of breath and wheezing.   Cardiovascular:  Negative for chest pain and palpitations.  Gastrointestinal:  Negative for abdominal pain, blood in stool, constipation and diarrhea.  Endocrine: Negative for polydipsia and polyuria.  Genitourinary:  Negative for dysuria, frequency and urgency.  Musculoskeletal:  Negative for arthralgias, back pain and myalgias.  Skin:  Negative for pallor and rash.  Allergic/Immunologic: Negative for environmental allergies.  Neurological:  Positive for headaches. Negative for dizziness, syncope and light-headedness.  Hematological:  Negative for adenopathy. Does not bruise/bleed easily.  Psychiatric/Behavioral:  Negative for decreased concentration and dysphoric mood. The patient is not nervous/anxious.        Objective:   Physical Exam Constitutional:      General: She is not in acute distress.    Appearance: Normal appearance. She is normal weight. She is not ill-appearing.  HENT:     Head: Normocephalic and atraumatic.     Comments: No facial swelling or tendeness No temporal tenderness     Right Ear: There is impacted cerumen.     Left Ear: Tympanic membrane and ear canal normal. There is no impacted cerumen.     Ears:     Comments: Procedure: Cerumen Disimpaction After consent obtained  Warm water was applied and gentle ear lavage performed on right ear.    There were no complications and following the disimpaction the tympanic membrane were visible on the bilateral. Tympanic  membranes are intact following the procedure.  Auditory canals are normal.  The patient reported relief of symptoms after removal of cerumen.  Right TM s/p irrigation-slightly more dull than the left  No erythema or retraction or bulging       Nose: Nose normal. No congestion or rhinorrhea.     Mouth/Throat:     Mouth: Mucous membranes are moist.     Pharynx: Oropharynx is clear. No oropharyngeal exudate or posterior oropharyngeal erythema.  Eyes:     General:        Right eye: No discharge.        Left eye: No discharge.  Conjunctiva/sclera: Conjunctivae normal.     Pupils: Pupils are equal, round, and reactive to light.  Cardiovascular:     Rate and Rhythm: Regular rhythm. Bradycardia present.  Musculoskeletal:     Cervical back: Neck supple. No tenderness.  Lymphadenopathy:     Cervical: No cervical adenopathy.  Skin:    General: Skin is warm and dry.     Findings: No erythema or rash.  Neurological:     Mental Status: She is alert.     Cranial Nerves: No cranial nerve deficit.  Psychiatric:        Mood and Affect: Mood normal.           Assessment & Plan:   Problem List Items Addressed This Visit       Nervous and Auditory   ETD (eustachian tube dysfunction)   S/p OM  Some muffled hearing  Reassuring exam did also remove some cerumen)  Instructed to increase flonase  to 2 sprays each nostril bid for one week then return to one spray   Update if not starting to improve in a week or if worsening  May need prednisone  if not better  Call back and Er precautions noted in detail today        Cerumen impaction - Primary   Right ear-mild  Treated with simple ear irrigation today-small amount of cerumen moved and able to better see TM   S/p OM treated successfully with clindamycin  Hearing is muffled   Encouraged use of debrox over the counter as needed  Call back and Er precautions noted in detail today

## 2024-03-01 LAB — HM MAMMOGRAPHY

## 2024-03-03 ENCOUNTER — Ambulatory Visit: Admitting: Family Medicine

## 2024-03-08 ENCOUNTER — Telehealth: Payer: Self-pay | Admitting: Family Medicine

## 2024-03-08 MED ORDER — PREDNISONE 10 MG PO TABS: ORAL_TABLET | ORAL | 0 refills | Status: DC

## 2024-03-08 NOTE — Telephone Encounter (Signed)
 I sent the prednisone    She can go back to daily on the flonase    Update if not starting to improve in 10 days or if worsening

## 2024-03-08 NOTE — Telephone Encounter (Signed)
 Copied from CRM [id number]. Topic: Clinical - Medical Advice >> Mar 08, 2024  8:27 AM Bambi Bonine D wrote: Reason for CRM: Pt stated that she came into the office to see Dr.Tower a week ago regarding her ears and was informed to reach back out if issue persists. Pt stated that her ears are not better and still muffled and wanted to know what the next steps are. Pt would like to receive a callback today regarding this concern.

## 2024-03-08 NOTE — Addendum Note (Signed)
 Addended by: Deri Fleet A on: 03/08/2024 01:28 PM   Modules accepted: Orders

## 2024-03-08 NOTE — Telephone Encounter (Signed)
 Pt notified Rx sent to pharmacy. Also advised of Dr. Belva Boyden comments and verbalized understanding. Will update if needed

## 2024-03-08 NOTE — Telephone Encounter (Addendum)
 From office note on 02/29/24 : Update if not starting to improve in a week or if worsening  May need prednisone  if not better  Called patient states all symptoms are in right side like in visit. No new or increased symptoms. She would like to try the prednisone . She has had in past with no issues. Verified pharmacy [facility].   She wanted to know if she needs to continue with increased Flonase  as recommended at visit.   Patient ok with my chart with information.

## 2024-03-20 ENCOUNTER — Ambulatory Visit: Payer: Self-pay

## 2024-03-20 DIAGNOSIS — H9201 Otalgia, right ear: Secondary | ICD-10-CM | POA: Insufficient documentation

## 2024-03-20 DIAGNOSIS — H6991 Unspecified Eustachian tube disorder, right ear: Secondary | ICD-10-CM

## 2024-03-20 NOTE — Telephone Encounter (Signed)
 FYI Only or Action Required?: Action required by provider: update on patient condition.  Patient was last seen in primary care on 02/29/2024 by Randeen Laine LABOR, MD. Called Nurse Triage reporting Ear Fullness. Symptoms began several weeks ago. Interventions attempted: Prescription medications: see note . Symptoms are: unchanged.  Triage Disposition: No disposition on file. Please see note.   Patient/caregiver understands and will follow disposition?: yes     Copied from CRM [id number]. Topic: Clinical - Medical Advice >> Mar 20, 2024  8:38 AM Kevelyn M wrote: Reason for CRM:  Finished steroids and still can't hear out of her right ear and it's still muffled. Please advise # [phone]. Please give her a call. Answer Assessment - Initial Assessment Questions 1. REASON FOR CALL or QUESTION:   Patient called to report that her R. Ear is still muffled.   Patient confirms she did complete the Prednisone  on Friday and has actively been using the ear drops that were prescribed to her.    Patient relays her R. Ear is still muffled.   Denies any signs of improvement or worsening symptoms  Patient confirms  its just as muffled as when I first came into the office. Nothing has changed.  Patient inquiring on what the next steps are.  Message routed appropriately.  Protocols used: Information Only Call - No Triage-A-AH

## 2024-03-20 NOTE — Telephone Encounter (Signed)
 Pt notified of Dr. Graham comments. Pt said years ago PCP sent her to [facility] in [place] she is okay going back there but if there is another practice that can see her sooner she doesn't mind going to 1st available provider in either city [place] or [place], pt advised PCP will place referral and if she hasn't received communication about referral within 2 weeks to let us  know.

## 2024-03-20 NOTE — Telephone Encounter (Signed)
 I put the referral in for ENT Please let us  know if you don't hear in 1-2 weeks to set that up

## 2024-03-20 NOTE — Telephone Encounter (Signed)
 Thanks for letting me know I want to refer to ENT specialist-please let me know if that is ok  If she prefers a specific provider or practice let me know  It can take a while to get in

## 2024-03-20 NOTE — Addendum Note (Signed)
 Addended by: RANDEEN HARDY A on: 03/20/2024 04:39 PM   Modules accepted: Orders

## 2024-04-03 ENCOUNTER — Telehealth: Payer: Self-pay | Admitting: Family Medicine

## 2024-04-03 NOTE — Telephone Encounter (Signed)
 Pt called requesting office info for ENT office she was referred to? Didn't see any info on referred office on referral notes. Please advise. Call back # [phone]

## 2024-04-04 ENCOUNTER — Telehealth: Payer: Self-pay | Admitting: Family Medicine

## 2024-04-04 NOTE — Telephone Encounter (Signed)
 PCP placed a referral last month for this, will route to referral dpt to make sure ENT has the referral info

## 2024-04-04 NOTE — Telephone Encounter (Signed)
 Copied from CRM [id number]. Topic: Referral - Question >> Apr 04, 2024 10:49 AM Lavanda D wrote: Reason for CRM: Patient is requesting information to be faxed over to [facility] in [facility] regarding her referral, they don't require a referral but advised that it would be best to send the referral in case of insurance covg. Phone#: [phone] Fax #: [phone]

## 2024-04-07 NOTE — Telephone Encounter (Signed)
 Referral faxed to [facility] per patient request and MyChart message sent to patient as well.

## 2024-05-23 ENCOUNTER — Encounter: Payer: Self-pay | Admitting: Family Medicine

## 2024-05-23 ENCOUNTER — Ambulatory Visit: Admitting: Family Medicine

## 2024-05-23 VITALS — BP 126/88 | HR 57 | Temp 98.1°F | Ht 63.0 in | Wt 130.4 lb

## 2024-05-23 DIAGNOSIS — H6991 Unspecified Eustachian tube disorder, right ear: Secondary | ICD-10-CM | POA: Diagnosis not present

## 2024-05-23 DIAGNOSIS — H9190 Unspecified hearing loss, unspecified ear: Secondary | ICD-10-CM | POA: Insufficient documentation

## 2024-05-23 DIAGNOSIS — H9191 Unspecified hearing loss, right ear: Secondary | ICD-10-CM

## 2024-05-23 NOTE — Patient Instructions (Addendum)
 Call Dr Edythe office about the hearing loss Ear looks good today   If you need a referral let us  know   If you need to back off the nasal spray due to nose bleed -do so   If you do develop ear pain or fever let us  know

## 2024-05-23 NOTE — Assessment & Plan Note (Signed)
 Right ear Started with OM in may Then ETD  Also cerumen impaction (resolved)  Seen here and then in ENT office   Normal exam today  At this point unsure if hearing loss is from ETD or not  Pt will call Dr Edythe office for appointment for further eval Will likely need/get audiogram

## 2024-05-23 NOTE — Progress Notes (Signed)
 Subjective:    Patient ID: Ruth Gill, female    DOB: Aug 15, 1961, 63 y.o.   MRN: [id number]  HPI  Wt Readings from Last 3 Encounters:  05/23/24 130 lb 6.4 oz (59.1 kg)  02/29/24 128 lb 8 oz (58.3 kg)  02/10/24 130 lb 4 oz (59.1 kg)   23.10 kg/m  Vitals:   05/23/24 1613  BP: 126/88  Pulse: (!) 57  Temp: 98.1 F (36.7 C)  SpO2: 96%    Pt presents for ear problems Right ear     Still has symptom of ear fullness No moe pain Some popping/ pressure and not hearing as well  Slightly off balance at times   Was treated with clinda for OM in may (also prednisone )  Followed up in June with ETD symptoms and also mild cerumen impaction (recommended use of debrox)  Also recommended increase in flonase  from 1 to 2 sprays in each nostril for short term   We placed referral to ENT in July Saw Dr Blair on 7/15 Removed more cerumen Treated with prednisone  for ETD   She took the prednisone   Then tried some sudafed-made her shaky  Then back to same loss of hearing   Still using steroid ns   Has had some mild nose bleed   No nasal congestion  No runny nose    Patient Active Problem List   Diagnosis Date Noted   Hearing loss 05/23/2024   Discomfort of right ear 03/20/2024   ETD (eustachian tube dysfunction) 02/29/2024   Otitis media 02/10/2024   Skin cancer screening 12/14/2023   Onychomycosis 09/07/2022   Hyperlipidemia 12/03/2021   Iron  deficiency 05/26/2021   Fatigue 04/15/2021   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 01/22/2020   Bilateral hand pain 01/12/2020   Hypothyroid 04/13/2014   Colon cancer screening 01/12/2014   Routine general medical examination at a health care facility 01/04/2014   Allergic rhinitis 12/22/2007   Past Medical History:  Diagnosis Date   Allergic rhinitis, cause unspecified    Anemia, unspecified    Heart murmur    MVP   Hyperlipidemia    Hypothyroidism    Mastodynia    MVP (mitral  valve prolapse)    Thyroid  disease    hypothyroid   Toenail fungus 10/2022   Past Surgical History:  Procedure Laterality Date   ABDOMINAL HYSTERECTOMY  2004   CARPOMETACARPAL ([facility]) FUSION OF THUMB Right 11/26/2022   Procedure: CARPOMETACARPAL ([facility]) FUSION OF THUMB;  Surgeon: Cleotilde Barrio, MD;  Location: [facility] ORS;  Service: Orthopedics;  Laterality: Right;  arthroplasty   MOUTH SURGERY     TMJ   Social History   Tobacco Use   Smoking status: Never   Smokeless tobacco: Never  Vaping Use   Vaping status: Never Used  Substance Use Topics   Alcohol use: No    Alcohol/week: 0.0 standard drinks of alcohol   Drug use: No   Family History  Problem Relation Age of Onset   Colon cancer Neg Hx    Colon polyps Neg Hx    Esophageal cancer Neg Hx    Rectal cancer Neg Hx    Stomach cancer Neg Hx    Allergies  Allergen Reactions   Azithromycin     REACTION: joint swelling, stiffness   Mobic [Meloxicam] Hives    Looks like sun burn   Penicillins     REACTION: SOB, rash   Terbinafine  And Related Diarrhea and Nausea Only   Current Outpatient Medications on  File Prior to Visit  Medication Sig Dispense Refill   Biotin 5000 MCG CAPS Take 1 capsule by mouth daily.     Cholecalciferol (VITAMIN D) 2000 UNITS tablet Take 4,000 Units by mouth daily.      diclofenac  (VOLTAREN ) 75 MG EC tablet Take 1 tablet by mouth 2 (two) times daily.     Efinaconazole  10 % SOLN Apply 1 drop topically daily. 4 mL 11   estradiol (ESTRACE) 1 MG tablet Take 1 tablet by mouth daily.     fexofenadine (ALLEGRA) 180 MG tablet Take 180 mg by mouth every morning.     fluticasone  (FLONASE ) 50 MCG/ACT nasal spray Place 2 sprays into both nostrils daily as needed. 48 mL 3   iron  polysaccharides (NIFEREX) 150 MG capsule Take 1 capsule (150 mg total) by mouth every other day. 45 capsule 3   levothyroxine  (SYNTHROID ) 88 MCG tablet Take 1 tablet (88 mcg total) by mouth daily before breakfast. 90 tablet 3   meclizine   (ANTIVERT ) 12.5 MG tablet Take 1 tablet (12.5 mg total) by mouth 3 (three) times daily as needed for dizziness. 15 tablet 0   Multiple Vitamins-Minerals (WOMENS MULTI GUMMIES) CHEW Chew 2 tablets by mouth daily.     Omega-3 Fatty Acids (FISH OIL) 1200 MG CAPS Take 1 capsule by mouth daily.     predniSONE  (DELTASONE ) 10 MG tablet Take 3 pills once daily by mouth for 3 days, then 2 pills once daily for 3 days, then 1 pill once daily for 3 days and then stop (Patient not taking: Reported on 05/23/2024) 18 tablet 0   No current facility-administered medications on file prior to visit.    Review of Systems  Constitutional:  Negative for activity change, appetite change, fatigue, fever and unexpected weight change.  HENT:  Positive for hearing loss. Negative for congestion, dental problem, ear discharge, ear pain, rhinorrhea, sinus pressure, sinus pain, sneezing and sore throat.   Eyes:  Negative for pain, redness and visual disturbance.  Respiratory:  Negative for cough, shortness of breath and wheezing.   Cardiovascular:  Negative for chest pain and palpitations.  Gastrointestinal:  Negative for abdominal pain, blood in stool, constipation and diarrhea.  Endocrine: Negative for polydipsia and polyuria.  Genitourinary:  Negative for dysuria, frequency and urgency.  Musculoskeletal:  Negative for arthralgias, back pain and myalgias.  Skin:  Negative for pallor and rash.  Allergic/Immunologic: Negative for environmental allergies.  Neurological:  Negative for dizziness, syncope, facial asymmetry and headaches.       Occational poor balance No spinning   Hematological:  Negative for adenopathy. Does not bruise/bleed easily.  Psychiatric/Behavioral:  Negative for decreased concentration and dysphoric mood. The patient is not nervous/anxious.        Objective:   Physical Exam Constitutional:      General: She is not in acute distress.    Appearance: Normal appearance. She is well-developed. She  is not ill-appearing or diaphoretic.  HENT:     Head: Normocephalic and atraumatic.     Right Ear: Tympanic membrane, ear canal and external ear normal.     Left Ear: Tympanic membrane, ear canal and external ear normal.     Ears:     Comments: No cerumen either side     Nose: Nose normal. No congestion or rhinorrhea.     Mouth/Throat:     Mouth: Mucous membranes are moist.     Pharynx: Oropharynx is clear. No posterior oropharyngeal erythema.  Eyes:     General:  Right eye: No discharge.        Left eye: No discharge.     Conjunctiva/sclera: Conjunctivae normal.     Pupils: Pupils are equal, round, and reactive to light.     Comments: No nystagmus  No dizziness with gaze direction change   Neck:     Thyroid : No thyromegaly.     Vascular: No carotid bruit or JVD.  Cardiovascular:     Rate and Rhythm: Regular rhythm. Bradycardia present.     Heart sounds: Normal heart sounds.     No gallop.  Pulmonary:     Effort: Pulmonary effort is normal. No respiratory distress.     Breath sounds: Normal breath sounds. No wheezing or rales.  Abdominal:     General: There is no distension or abdominal bruit.     Palpations: Abdomen is soft.  Musculoskeletal:     Cervical back: Normal range of motion and neck supple.     Right lower leg: No edema.     Left lower leg: No edema.  Lymphadenopathy:     Cervical: No cervical adenopathy.  Skin:    General: Skin is warm and dry.     Coloration: Skin is not pale.     Findings: No rash.  Neurological:     Mental Status: She is alert.     Cranial Nerves: No cranial nerve deficit.     Coordination: Coordination normal.     Gait: Gait normal.     Deep Tendon Reflexes: Reflexes are normal and symmetric. Reflexes normal.  Psychiatric:        Mood and Affect: Mood normal.           Assessment & Plan:   Problem List Items Addressed This Visit       Nervous and Auditory   Hearing loss - Primary   Right ear Started with OM in  may Then ETD  Also cerumen impaction (resolved)  Seen here and then in ENT office   Normal exam today  At this point unsure if hearing loss is from ETD or not  Pt will call Dr Edythe office for appointment for further eval Will likely need/get audiogram       ETD (eustachian tube dysfunction)   Reviewed notes from Dr Blair (ENT) regarding this from July  Was treated with prednisone  and recommended flonase    Per pt got better then worse again   Normal ear today- normal appearing TM   Recommend follow up with ENT / may need audiogram and further eval

## 2024-05-23 NOTE — Assessment & Plan Note (Signed)
 Reviewed notes from Dr Blair (ENT) regarding this from July  Was treated with prednisone  and recommended flonase    Per pt got better then worse again   Normal ear today- normal appearing TM   Recommend follow up with ENT / may need audiogram and further eval

## 2024-09-11 ENCOUNTER — Ambulatory Visit
Admission: EM | Admit: 2024-09-11 | Discharge: 2024-09-11 | Disposition: A | Discharge status: Home / Self Care | Payer: Medicare (Managed Care) | Source: Ambulatory Visit

## 2024-09-11 ENCOUNTER — Encounter: Payer: Self-pay | Admitting: Emergency Medicine

## 2024-09-11 DIAGNOSIS — R0981 Nasal congestion: Secondary | ICD-10-CM | POA: Diagnosis not present

## 2024-09-11 DIAGNOSIS — J101 Influenza due to other identified influenza virus with other respiratory manifestations: Secondary | ICD-10-CM

## 2024-09-11 LAB — POCT INFLUENZA A/B
Influenza A, POC: POSITIVE — AB
Influenza B, POC: NEGATIVE

## 2024-09-11 MED ORDER — PREDNISONE 10 MG (21) PO TBPK: ORAL_TABLET | Freq: Every day | ORAL | 0 refills | Status: AC

## 2024-09-11 NOTE — ED Provider Notes (Signed)
 [facility]    CSN: [id number] Arrival date & time: 09/11/24  1718      History   Chief Complaint Chief Complaint  Patient presents with   Nasal Congestion    HPI HANAKO TIPPING is a 63 y.o. female.   Patient presents for evaluation of nasal congestion, right sided ear fullness and mild sinus pain and pressure present for 4 days.  Tolerable to food and liquids.  Has attempted use of Mucinex Flonase  and Allegra.  Known sick contacts with exposure to influenza A.  Denies fever cough.     Past Medical History:  Diagnosis Date   Allergic rhinitis, cause unspecified    Anemia, unspecified    Heart murmur    MVP   Hyperlipidemia    Hypothyroidism    Mastodynia    MVP (mitral valve prolapse)    Thyroid  disease    hypothyroid   Toenail fungus 10/2022    Patient Active Problem List   Diagnosis Date Noted   Hearing loss 05/23/2024   Discomfort of right ear 03/20/2024   ETD (eustachian tube dysfunction) 02/29/2024   Otitis media 02/10/2024   Skin cancer screening 12/14/2023   Onychomycosis 09/07/2022   Hyperlipidemia 12/03/2021   Iron  deficiency 05/26/2021   Fatigue 04/15/2021   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 01/22/2020   Bilateral hand pain 01/12/2020   Hypothyroid 04/13/2014   Colon cancer screening 01/12/2014   Routine general medical examination at a health care facility 01/04/2014   Allergic rhinitis 12/22/2007    Past Surgical History:  Procedure Laterality Date   ABDOMINAL HYSTERECTOMY  2004   CARPOMETACARPAL [facility]) FUSION OF THUMB Right 11/26/2022   Procedure: CARPOMETACARPAL [facility]) FUSION OF THUMB;  Surgeon: Cleotilde Barrio, MD;  Location: [facility] ORS;  Service: Orthopedics;  Laterality: Right;  arthroplasty   MOUTH SURGERY     TMJ    OB History   No obstetric history on file.      Home Medications    Prior to Admission medications  Medication Sig Start Date End Date Taking? Authorizing Provider   Biotin 5000 MCG CAPS Take 1 capsule by mouth daily.    [provider]  Cholecalciferol (VITAMIN D) 2000 UNITS tablet Take 4,000 Units by mouth daily.     [provider]  diclofenac  (VOLTAREN ) 75 MG EC tablet Take 1 tablet by mouth 2 (two) times daily. 07/18/20   [provider]  Efinaconazole  10 % SOLN Apply 1 drop topically daily. 01/11/23   McDonald, Juliene SAUNDERS, DPM  estradiol (ESTRACE) 1 MG tablet Take 1 tablet by mouth daily. 08/09/11   [provider]  fexofenadine (ALLEGRA) 180 MG tablet Take 180 mg by mouth every morning.    [provider]  fluticasone  (FLONASE ) 50 MCG/ACT nasal spray Place 2 sprays into both nostrils daily as needed. 12/14/23   Tower, Laine LABOR, MD  iron  polysaccharides (NIFEREX) 150 MG capsule Take 1 capsule (150 mg total) by mouth every other day. 12/14/23   Tower, Laine LABOR, MD  levothyroxine  (SYNTHROID ) 88 MCG tablet Take 1 tablet (88 mcg total) by mouth daily before breakfast. 12/14/23   Tower, Laine LABOR, MD  meclizine  (ANTIVERT ) 12.5 MG tablet Take 1 tablet (12.5 mg total) by mouth 3 (three) times daily as needed for dizziness. 07/19/18   Bernard Drivers, MD  Multiple Vitamins-Minerals (WOMENS MULTI GUMMIES) CHEW Chew 2 tablets by mouth daily.    [provider]  Omega-3 Fatty Acids (FISH OIL) 1200 MG CAPS Take  1 capsule by mouth daily.    [provider]  predniSONE  (DELTASONE ) 10 MG tablet Take 3 pills once daily by mouth for 3 days, then 2 pills once daily for 3 days, then 1 pill once daily for 3 days and then stop Patient not taking: Reported on 05/23/2024 03/08/24   Tower, Laine LABOR, MD    Family History Family History  Problem Relation Age of Onset   Colon cancer Neg Hx    Colon polyps Neg Hx    Esophageal cancer Neg Hx    Rectal cancer Neg Hx    Stomach cancer Neg Hx     Social History Social History[1]   Allergies   Azithromycin, Mobic [meloxicam], Penicillins, and Terbinafine  and related   Review  of Systems Review of Systems  Constitutional: Negative.   HENT:  Positive for congestion, ear pain, sinus pressure and sinus pain. Negative for dental problem, drooling, ear discharge, facial swelling, hearing loss, mouth sores, nosebleeds, postnasal drip, rhinorrhea, sneezing, sore throat, tinnitus, trouble swallowing and voice change.   Respiratory: Negative.    Cardiovascular: Negative.   Gastrointestinal: Negative.   Neurological: Negative.      Physical Exam Triage Vital Signs ED Triage Vitals  Encounter Vitals Group     BP 09/11/24 1749 (!) 141/85     Girls Systolic BP Percentile --      Girls Diastolic BP Percentile --      Boys Systolic BP Percentile --      Boys Diastolic BP Percentile --      Pulse Rate 09/11/24 1749 60     Resp 09/11/24 1749 20     Temp 09/11/24 1749 98.1 F (36.7 C)     Temp Source 09/11/24 1749 Oral     SpO2 09/11/24 1749 98 %     Weight --      Height --      Head Circumference --      Peak Flow --      Pain Score 09/11/24 1745 0     Pain Loc --      Pain Education --      Exclude from Growth Chart --    No data found.  Updated Vital Signs BP (!) 141/85 (BP Location: Left Arm)   Pulse 60   Temp 98.1 F (36.7 C) (Oral)   Resp 20   SpO2 98%   Visual Acuity Right Eye Distance:   Left Eye Distance:   Bilateral Distance:    Right Eye Near:   Left Eye Near:    Bilateral Near:     Physical Exam Constitutional:      Appearance: Normal appearance.  HENT:     Head: Normocephalic.     Right Ear: Tympanic membrane, ear canal and external ear normal.     Left Ear: Tympanic membrane, ear canal and external ear normal.     Nose: Congestion present.  Eyes:     Extraocular Movements: Extraocular movements intact.  Cardiovascular:     Rate and Rhythm: Normal rate and regular rhythm.     Pulses: Normal pulses.     Heart sounds: Normal heart sounds.  Pulmonary:     Effort: Pulmonary effort is normal.     Breath sounds: Normal breath  sounds.  Neurological:     Mental Status: She is alert and oriented to person, place, and time. Mental status is at baseline.      UC Treatments / Results  Labs (all labs ordered are listed,  but only abnormal results are displayed) Labs Reviewed  POCT INFLUENZA A/B    EKG   Radiology No results found.  Procedures Procedures (including critical care time)  Medications Ordered in UC Medications - No data to display  Initial Impression / Assessment and Plan / UC Course  I have reviewed the triage vital signs and the nursing notes.  Pertinent labs & imaging results that were available during my care of the patient were reviewed by me and considered in my medical decision making (see chart for details).  Influenza A, nasal congestion  Patient is in no signs of distress nor toxic appearing.  Vital signs are stable.  Low suspicion for pneumonia, pneumothorax or bronchitis and therefore will defer imaging.  Past window for Tamiflu, discussed with patient, prescribed prednisone  and recommended continue over-the-counter medications. May use additional over-the-counter medications as needed for supportive care.  May follow-up with urgent care as needed if symptoms persist or worsen.  Final Clinical Impressions(s) / UC Diagnoses   Final diagnoses:  Nasal congestion   Discharge Instructions   None    ED Prescriptions   None    PDMP not reviewed this encounter.     [1]  Social History Tobacco Use   Smoking status: Never   Smokeless tobacco: Never  Vaping Use   Vaping status: Never Used  Substance Use Topics   Alcohol use: No    Alcohol/week: 0.0 standard drinks of alcohol   Drug use: No     Teresa Shelba SAUNDERS, NP 09/11/24 1827

## 2024-09-11 NOTE — Discharge Instructions (Signed)
 Influenza A is a virus and should steadily improve in time it can take up to 7 to 10 days before you truly start to see a turnaround however things will get better  Unfortunately as this is day 4 of your illness we are unable to use Tamiflu at this time, must be initiated within the first 72 hours  Will need to quarantine and stay home until without fever for 24 hours, if no fever will need to wear mask for 5 days from onset of symptoms  Starting tomorrow take prednisone  every morning with food to reduce sinus pressure  May continue use of nasal spray and over-the-counter medicine    You can take Tylenol  and/or Ibuprofen as needed for fever reduction and pain relief.   For cough: honey 1/2 to 1 teaspoon (you can dilute the honey in water or another fluid).  You can also use guaifenesin and dextromethorphan for cough. You can use a humidifier for chest congestion and cough.  If you don't have a humidifier, you can sit in the bathroom with the hot shower running.      For sore throat: try warm salt water gargles, cepacol lozenges, throat spray, warm tea or water with lemon/honey, popsicles or ice, or OTC cold relief medicine for throat discomfort.   For congestion: take a daily anti-histamine like Zyrtec, Claritin, and a oral decongestant, such as pseudoephedrine.  You can also use Flonase  1-2 sprays in each nostril daily.   It is important to stay hydrated: drink plenty of fluids (water, gatorade/powerade/pedialyte, juices, or teas) to keep your throat moisturized and help further relieve irritation/discomfort.

## 2024-09-11 NOTE — ED Triage Notes (Signed)
 Patient reports nasal congestion and reports that has been exposed to 3 family members that have has Flu A x 4 days. Denies pain at this time. Patient has used mucinex, Flonase  and allegra with mild relief.

## 2024-09-29 ENCOUNTER — Other Ambulatory Visit: Payer: Self-pay | Admitting: Family Medicine

## 2024-09-29 ENCOUNTER — Telehealth: Payer: Self-pay | Admitting: Family Medicine

## 2024-09-29 MED ORDER — POLYSACCHARIDE IRON COMPLEX 150 MG PO CAPS: 150.0000 mg | ORAL_CAPSULE | ORAL | 1 refills | Status: AC

## 2024-09-29 NOTE — Telephone Encounter (Signed)
 Copied from CRM [id number]. Topic: Clinical - Medication Refill >> Sep 29, 2024  1:00 PM China J wrote: Medication:  iron  polysaccharides (NIFEREX) 150 MG capsule  Has the patient contacted their pharmacy? Yes (Agent: If no, request that the patient contact the pharmacy for the refill. If patient does not wish to contact the pharmacy document the reason why and proceed with request.) (Agent: If yes, when and what did the pharmacy advise?)  This is the patient's preferred pharmacy:  [id number] - [place], [place] - [address] AT [facility] [address] [place] [address] Phone: [phone] Fax: [phone]  Is this the correct pharmacy for this prescription? Yes If no, delete pharmacy and type the correct one.   Has the prescription been filled recently? No  Is the patient out of the medication? No  Has the patient been seen for an appointment in the last year OR does the patient have an upcoming appointment? Yes  Can we respond through MyChart? Yes  Agent: Please be advised that Rx refills may take up to 3 business days. We ask that you follow-up with your pharmacy. >> Sep 29, 2024  1:04 PM China J wrote: Patient wanted to let Dr. Randeen know that the 3 refills available on her script were not showing on her pharmacy's side. She was wondering if the script could be redone so automatic refills could continue.

## 2024-09-29 NOTE — Telephone Encounter (Signed)
 Copied from CRM [id number]. Topic: Clinical - Medication Refill >> Sep 29, 2024  1:00 PM China J wrote: Medication:  iron  polysaccharides (NIFEREX) 150 MG capsule  Has the patient contacted their pharmacy? Yes (Agent: If no, request that the patient contact the pharmacy for the refill. If patient does not wish to contact the pharmacy document the reason why and proceed with request.) (Agent: If yes, when and what did the pharmacy advise?)  This is the patient's preferred pharmacy:  [id number] - [place], [place] - [address] AT [facility] [address] [facility] [address] [place] [address] Phone: [phone] Fax: [phone]  Is this the correct pharmacy for this prescription? Yes If no, delete pharmacy and type the correct one.   Has the prescription been filled recently? No  Is the patient out of the medication? No  Has the patient been seen for an appointment in the last year OR does the patient have an upcoming appointment? Yes  Can we respond through MyChart? Yes  Agent: Please be advised that Rx refills may take up to 3 business days. We ask that you follow-up with your pharmacy.

## 2024-10-28 ENCOUNTER — Ambulatory Visit
Admission: EM | Admit: 2024-10-28 | Discharge: 2024-10-28 | Disposition: A | Discharge status: Home / Self Care | Payer: Medicare (Managed Care) | Attending: Emergency Medicine | Admitting: Emergency Medicine

## 2024-10-28 ENCOUNTER — Encounter: Payer: Self-pay | Admitting: Emergency Medicine

## 2024-10-28 DIAGNOSIS — B349 Viral infection, unspecified: Secondary | ICD-10-CM

## 2024-10-28 LAB — POC COVID19/FLU A&B COMBO
Covid Antigen, POC: NEGATIVE
Influenza A Antigen, POC: NEGATIVE
Influenza B Antigen, POC: NEGATIVE

## 2024-10-28 LAB — POCT RAPID STREP A (OFFICE): Rapid Strep A Screen: NEGATIVE

## 2024-10-28 NOTE — ED Triage Notes (Signed)
 Patient reports sore throat, right ear fullness and nasal drainage that started 1 day ago. Patient reports husband is on chemo so she wants to be safe. Rates pain 5/10. Patient has been taking Tylenol  and salt water gargles with mild relief.

## 2024-10-28 NOTE — Discharge Instructions (Addendum)
 Your symptoms today are most likely being caused by a virus and should steadily improve in time it can take up to 7 to 10 days before you truly start to see a turnaround however things will get better  COVID flu and strep test negative    You can take Tylenol and/or Ibuprofen as needed for fever reduction and pain relief.   For cough: honey 1/2 to 1 teaspoon (you can dilute the honey in water or another fluid).  You can also use guaifenesin and dextromethorphan for cough. You can use a humidifier for chest congestion and cough.  If you don't have a humidifier, you can sit in the bathroom with the hot shower running.      For sore throat: try warm salt water gargles, cepacol lozenges, throat spray, warm tea or water with lemon/honey, popsicles or ice, or OTC cold relief medicine for throat discomfort.   For congestion: take a daily anti-histamine like Zyrtec, Claritin, and a oral decongestant, such as pseudoephedrine.  You can also use Flonase 1-2 sprays in each nostril daily.   It is important to stay hydrated: drink plenty of fluids (water, gatorade/powerade/pedialyte, juices, or teas) to keep your throat moisturized and help further relieve irritation/discomfort.
